# Patient Record
Sex: Male | Born: 1955
Health system: Southern US, Community
[De-identification: ages and names within clinical notes are randomized; demographics above are authoritative.]

## PROBLEM LIST (undated history)

## (undated) DIAGNOSIS — E785 Hyperlipidemia, unspecified: Secondary | ICD-10-CM

## (undated) DIAGNOSIS — E119 Type 2 diabetes mellitus without complications: Secondary | ICD-10-CM

## (undated) DIAGNOSIS — Z973 Presence of spectacles and contact lenses: Secondary | ICD-10-CM

## (undated) DIAGNOSIS — C61 Malignant neoplasm of prostate: Secondary | ICD-10-CM

## (undated) DIAGNOSIS — N401 Enlarged prostate with lower urinary tract symptoms: Secondary | ICD-10-CM

## (undated) DIAGNOSIS — I10 Essential (primary) hypertension: Secondary | ICD-10-CM

## (undated) DIAGNOSIS — Z9189 Other specified personal risk factors, not elsewhere classified: Secondary | ICD-10-CM

## (undated) HISTORY — PX: PROSTATE BIOPSY: SHX241

## (undated) HISTORY — PX: NO PAST SURGERIES: SHX2092

## (undated) HISTORY — DX: Hyperlipidemia, unspecified: E78.5

## (undated) HISTORY — DX: Essential (primary) hypertension: I10

---

## 2017-10-27 ENCOUNTER — Other Ambulatory Visit: Payer: Self-pay | Admitting: Physician Assistant

## 2017-10-27 DIAGNOSIS — H905 Unspecified sensorineural hearing loss: Secondary | ICD-10-CM | POA: Insufficient documentation

## 2017-10-27 DIAGNOSIS — H903 Sensorineural hearing loss, bilateral: Secondary | ICD-10-CM | POA: Insufficient documentation

## 2017-10-27 DIAGNOSIS — H9312 Tinnitus, left ear: Secondary | ICD-10-CM | POA: Insufficient documentation

## 2017-11-05 ENCOUNTER — Ambulatory Visit
Admission: RE | Admit: 2017-11-05 | Discharge: 2017-11-05 | Disposition: A | Discharge status: Home / Self Care | Payer: BLUE CROSS/BLUE SHIELD | Source: Ambulatory Visit | Attending: Physician Assistant | Admitting: Physician Assistant

## 2017-11-05 DIAGNOSIS — H9312 Tinnitus, left ear: Secondary | ICD-10-CM

## 2017-11-05 MED ORDER — GADOBENATE DIMEGLUMINE 529 MG/ML IV SOLN
20.0000 mL | Freq: Once | INTRAVENOUS | Status: AC | PRN
  Administered 2017-11-05: 20 mL via INTRAVENOUS

## 2018-06-23 DIAGNOSIS — R972 Elevated prostate specific antigen [PSA]: Secondary | ICD-10-CM | POA: Diagnosis not present

## 2018-07-01 ENCOUNTER — Ambulatory Visit: Payer: BLUE CROSS/BLUE SHIELD | Admitting: Endocrinology

## 2018-07-01 ENCOUNTER — Encounter: Payer: Self-pay | Admitting: Endocrinology

## 2018-07-01 DIAGNOSIS — I10 Essential (primary) hypertension: Secondary | ICD-10-CM | POA: Diagnosis not present

## 2018-07-01 DIAGNOSIS — E785 Hyperlipidemia, unspecified: Secondary | ICD-10-CM | POA: Diagnosis not present

## 2018-07-01 DIAGNOSIS — E119 Type 2 diabetes mellitus without complications: Secondary | ICD-10-CM | POA: Insufficient documentation

## 2018-07-01 LAB — POCT GLYCOSYLATED HEMOGLOBIN (HGB A1C): Hemoglobin A1C: 9.8 % — AB (ref 4.0–5.6)

## 2018-07-01 MED ORDER — GLUCOSE BLOOD VI STRP: 1.0000 | ORAL_STRIP | Freq: Every day | 12 refills | Status: DC

## 2018-07-01 MED ORDER — SEMAGLUTIDE (1 MG/DOSE) 2 MG/1.5ML ~~LOC~~ SOPN: 1.0000 mg | PEN_INJECTOR | SUBCUTANEOUS | 11 refills | Status: DC

## 2018-07-01 NOTE — Progress Notes (Signed)
Subjective:    Patient ID: Tony Bogus., male    DOB: 11-Nov-1955, 62 y.o.   MRN: [id number]  HPI pt is referred by Docia Barrier, PA, for diabetes.  Pt states DM was dx'ed in 2017; he has mild if any neuropathy of the lower extremities; he is unaware of any associated chronic complications; he has never been on insulin; pt says his diet and exercise are poor; he has never had pancreatitis, pancreatic surgery, severe hypoglycemia or DKA.  He has not recently checked cbg's. Past Medical History:  Diagnosis Date  . Dyslipidemia   . HTN (hypertension)     Social History   Socioeconomic History  . Marital status: Married    Spouse name: Not on file  . Number of children: Not on file  . Years of education: Not on file  . Highest education level: Not on file  Occupational History  . Not on file  Social Needs  . Financial resource strain: Not on file  . Food insecurity:    Worry: Not on file    Inability: Not on file  . Transportation needs:    Medical: Not on file    Non-medical: Not on file  Tobacco Use  . Smoking status: Never Smoker  . Smokeless tobacco: Never Used  Substance and Sexual Activity  . Alcohol use: Yes    Comment: occ  . Drug use: Never  . Sexual activity: Yes    Partners: Female  Lifestyle  . Physical activity:    Days per week: Not on file    Minutes per session: Not on file  . Stress: Not on file  Relationships  . Social connections:    Talks on phone: Not on file    Gets together: Not on file    Attends religious service: Not on file    Active member of club or organization: Not on file    Attends meetings of clubs or organizations: Not on file    Relationship status: Not on file  . Intimate partner violence:    Fear of current or ex partner: Not on file    Emotionally abused: Not on file    Physically abused: Not on file    Forced sexual activity: Not on file  Other Topics Concern  . Not on file  Social History Narrative  . Not on file     Current Outpatient Medications on File Prior to Visit  Medication Sig Dispense Refill  . HYDROcodone-acetaminophen (NORCO/VICODIN) 5-325 MG tablet TAKE 1 TABLET BY MOUTH AS DIRECTED. TAKE 30 MINUTES PRIOR TO YOUR PROSTATE BIOPSY  0  . lisinopril (PRINIVIL,ZESTRIL) 10 MG tablet     . metFORMIN (GLUCOPHAGE) 500 MG tablet Take 500 mg by mouth 2 (two) times daily.  3  . omeprazole (PRILOSEC) 40 MG capsule TAKE 1 (ONE) CAPSULE DAILY    . rosuvastatin (CRESTOR) 10 MG tablet      No current facility-administered medications on file prior to visit.     No Known Allergies  Family History  Problem Relation Age of Onset  . Diabetes Brother     BP (!) 190/110 (BP Location: Right Arm, Patient Position: Sitting, Cuff Size: Large)   Pulse 82   Ht 6' (1.829 m)   Wt (!) 341 lb 3.2 oz (154.8 kg)   SpO2 97%   BMI 46.28 kg/m    Review of Systems denies blurry vision, headache, chest pain, sob, n/v, muscle cramps, excessive diaphoresis, memory loss, depression, cold intolerance, rhinorrhea,  and easy bruising.  He has chronic weight gain.  He has polyuria.      Objective:   Physical Exam VS: see vs page GEN: no distress.  Morbid obesity HEAD: head: no deformity eyes: no periorbital swelling, no proptosis external nose and ears are normal mouth: no lesion seen NECK: supple, thyroid is not enlarged CHEST WALL: no deformity LUNGS: clear to auscultation CV: reg rate and rhythm, no murmur ABD: abdomen is soft, nontender.  no hepatosplenomegaly.  not distended.  Self-reducing ventral hernia MUSCULOSKELETAL: muscle bulk and strength are grossly normal.  no obvious joint swelling.  gait is normal and steady EXTEMITIES: no deformity.  no ulcer on the feet.  feet are of normal color and temp.  1+ bilat leg edema.  There is bilateral onychomycosis of the toenails.   PULSES: dorsalis pedis intact bilat.  no carotid bruit NEURO:  cn 2-12 grossly intact.   readily moves all 4's.  sensation is intact  to touch on the feet SKIN:  Normal texture and temperature.  No rash or suspicious lesion is visible.   NODES:  None palpable at the neck.   PSYCH: alert, well-oriented.  Does not appear anxious nor depressed.    A1c=9.8%  I have reviewed outside records, and summarized: Pt was noted to have elevated a1c, and referred here.  Other problems addressed were ongoing URI, HTN, and vit-D def  outside test results are reviewed: Creat=1.0    Assessment & Plan:  Type 2 DM: he needs increased rx Edema: this limits rx options Obesity: new to me HTN: is noted today  Patient Instructions  Your blood pressure is high today.  Please see your primary care provider soon, to have it rechecked. good diet and exercise significantly improve the control of your diabetes.  please let me know if you wish to be referred to a dietician.  high blood sugar is very risky to your health.  you should see an eye doctor and dentist every year.  It is very important to get all recommended vaccinations.  Controlling your blood pressure and cholesterol drastically reduces the damage diabetes does to your body.  Those who smoke should quit.  Please discuss these with your doctor.  check your blood sugar once a day.  vary the time of day when you check, between before the 3 meals, and at bedtime.  also check if you have symptoms of your blood sugar being too high or too low.  please keep a record of the readings and bring it to your next appointment here (or you can bring the meter itself).  You can write it on any piece of paper.  please call us sooner if your blood sugar goes below 70, or if you have a lot of readings over 200.   Here is a new meter.  I have sent a prescription to your pharmacy, for strips.  Please change the Januvia to Ozempic. Please call or message [place] in 1-2 weeks, to tell us how the blood sugar is doing.   We can add other pills if necessary.   Please come back for a follow-up appointment in 2 months.         Bariatric Surgery You have so much to gain by losing weight.  You may have already tried every diet and exercise plan imaginable.  And, you may have sought advice from your family physician, too.   Sometimes, in spite of such diligent efforts, you may not be able to achieve long-term  results by yourself.  In cases of severe obesity, bariatric or weight loss surgery is a proven method of achieving long-term weight control.  Our Services Our bariatric surgery programs offer our patients new hope and long-term weight-loss solution.  Since introducing our services in 2003, we have conducted more than 2,400 successful procedures.  Our program is designated as a Programmer, multimedia by the Metabolic and Bariatric Surgery Accreditation and Quality Improvement Program (MBSAQIP), a IT trainer that sets rigorous patient safety and outcome standards.  Our program is also designated as a Ecologist by [employer].   Our exceptional weight-loss surgery team specializes in diagnosis, treatment, follow-up care, and ongoing support for our patients with severe weight loss challenges.  We currently offer laparoscopic sleeve gastrectomy, gastric bypass, and adjustable gastric band (LAP-BAND).    Attend our [place] Choosing to undergo a bariatric procedure is a big decision, and one that should not be taken lightly.  You now have two options in how you learn about weight-loss surgery - in person or online.  Our objective is to ensure you have all of the information that you need to evaluate the advantages and obligations of this life changing procedure.  Please note that you are not alone in this process, and our experienced team is ready to assist and answer all of your questions.  There are several ways to register for a seminar (either on-line or in person): 1)  Call [phone] 2) Go on-line to [facility] and register for either type of seminar.   [email or web address]

## 2018-07-01 NOTE — Patient Instructions (Addendum)
Your blood pressure is high today.  Please see your primary care provider soon, to have it rechecked. good diet and exercise significantly improve the control of your diabetes.  please let me know if you wish to be referred to a dietician.  high blood sugar is very risky to your health.  you should see an eye doctor and dentist every year.  It is very important to get all recommended vaccinations.  Controlling your blood pressure and cholesterol drastically reduces the damage diabetes does to your body.  Those who smoke should quit.  Please discuss these with your doctor.  check your blood sugar once a day.  vary the time of day when you check, between before the 3 meals, and at bedtime.  also check if you have symptoms of your blood sugar being too high or too low.  please keep a record of the readings and bring it to your next appointment here (or you can bring the meter itself).  You can write it on any piece of paper.  please call us sooner if your blood sugar goes below 70, or if you have a lot of readings over 200.   Here is a new meter.  I have sent a prescription to your pharmacy, for strips.  Please change the Januvia to Ozempic. Please call or message [place] in 1-2 weeks, to tell us how the blood sugar is doing.   We can add other pills if necessary.   Please come back for a follow-up appointment in 2 months.        Bariatric Surgery You have so much to gain by losing weight.  You may have already tried every diet and exercise plan imaginable.  And, you may have sought advice from your family physician, too.   Sometimes, in spite of such diligent efforts, you may not be able to achieve long-term results by yourself.  In cases of severe obesity, bariatric or weight loss surgery is a proven method of achieving long-term weight control.  Our Services Our bariatric surgery programs offer our patients new hope and long-term weight-loss solution.  Since introducing our services in 2003, we have  conducted more than 2,400 successful procedures.  Our program is designated as a Programmer, multimedia by the Metabolic and Bariatric Surgery Accreditation and Quality Improvement Program (MBSAQIP), a IT trainer that sets rigorous patient safety and outcome standards.  Our program is also designated as a Ecologist by [employer].   Our exceptional weight-loss surgery team specializes in diagnosis, treatment, follow-up care, and ongoing support for our patients with severe weight loss challenges.  We currently offer laparoscopic sleeve gastrectomy, gastric bypass, and adjustable gastric band (LAP-BAND).    Attend our [place] Choosing to undergo a bariatric procedure is a big decision, and one that should not be taken lightly.  You now have two options in how you learn about weight-loss surgery - in person or online.  Our objective is to ensure you have all of the information that you need to evaluate the advantages and obligations of this life changing procedure.  Please note that you are not alone in this process, and our experienced team is ready to assist and answer all of your questions.  There are several ways to register for a seminar (either on-line or in person): 1)  Call [phone] 2) Go on-line to [facility] and register for either type of seminar.  [email or web address]

## 2018-07-03 ENCOUNTER — Encounter: Payer: Self-pay | Admitting: Endocrinology

## 2018-07-03 DIAGNOSIS — E785 Hyperlipidemia, unspecified: Secondary | ICD-10-CM | POA: Insufficient documentation

## 2018-07-03 DIAGNOSIS — I10 Essential (primary) hypertension: Secondary | ICD-10-CM | POA: Insufficient documentation

## 2018-07-06 DIAGNOSIS — E119 Type 2 diabetes mellitus without complications: Secondary | ICD-10-CM | POA: Diagnosis not present

## 2018-07-13 ENCOUNTER — Telehealth: Payer: Self-pay | Admitting: Endocrinology

## 2018-07-13 MED ORDER — GLUCOSE BLOOD VI STRP: 1.0000 | ORAL_STRIP | Freq: Every day | 3 refills | Status: DC

## 2018-07-13 NOTE — Telephone Encounter (Signed)
Patient stated when he was in last week he was given a contour next EZ meter. He stated that the prescription that he was given was for the one touch ultra blue and they do not work with his meter.  Please advise   [id number] - [place], [place] - [address]

## 2018-07-13 NOTE — Telephone Encounter (Signed)
Please advise 

## 2018-07-13 NOTE — Telephone Encounter (Signed)
Ok, I have sent a prescription to your pharmacy, for the contour strips

## 2018-07-18 DIAGNOSIS — C61 Malignant neoplasm of prostate: Secondary | ICD-10-CM | POA: Diagnosis not present

## 2018-07-18 DIAGNOSIS — R351 Nocturia: Secondary | ICD-10-CM | POA: Diagnosis not present

## 2018-07-18 DIAGNOSIS — N401 Enlarged prostate with lower urinary tract symptoms: Secondary | ICD-10-CM | POA: Diagnosis not present

## 2018-07-20 ENCOUNTER — Encounter: Payer: Self-pay | Admitting: Radiation Oncology

## 2018-07-25 ENCOUNTER — Other Ambulatory Visit (HOSPITAL_COMMUNITY): Payer: Self-pay | Admitting: Urology

## 2018-07-25 DIAGNOSIS — C61 Malignant neoplasm of prostate: Secondary | ICD-10-CM

## 2018-07-26 DIAGNOSIS — E7800 Pure hypercholesterolemia, unspecified: Secondary | ICD-10-CM | POA: Diagnosis not present

## 2018-07-26 DIAGNOSIS — R972 Elevated prostate specific antigen [PSA]: Secondary | ICD-10-CM | POA: Diagnosis not present

## 2018-07-26 DIAGNOSIS — I10 Essential (primary) hypertension: Secondary | ICD-10-CM | POA: Diagnosis not present

## 2018-07-26 DIAGNOSIS — Z79899 Other long term (current) drug therapy: Secondary | ICD-10-CM | POA: Diagnosis not present

## 2018-07-26 DIAGNOSIS — E1165 Type 2 diabetes mellitus with hyperglycemia: Secondary | ICD-10-CM | POA: Diagnosis not present

## 2018-08-04 NOTE — Progress Notes (Signed)
GU Location of Tumor / Histology: prostatic adenocarcinoma  If Prostate Cancer, Gleason Score is (4 + 3) and PSA is (8.8) on 03/2018. Prostate volume: 63 cm  Tony Griffin. was referred by Docia Barrier, PA-C for evaluation of an elevated PSA value in June 2019.   Biopsies of prostate (if applicable) revealed:    Past/Anticipated interventions by urology, if any: vasectomy, prostate biopsy  Past/Anticipated interventions by medical oncology, if any: no  Weight changes, if any: no  Bowel/Bladder complaints, if any:  IPSS 7. SHIM 18.   Nausea/Vomiting, if any: no  Pain issues, if any:  no  SAFETY ISSUES:  Prior radiation? no  Pacemaker/ICD? no  Possible current pregnancy? no  Is the patient on methotrexate? no  Current Complaints / other details:  63 year old male. Married with one son. Bone scan done 08/05/2018.

## 2018-08-05 ENCOUNTER — Encounter (HOSPITAL_COMMUNITY)
Admission: RE | Admit: 2018-08-05 | Discharge: 2018-08-05 | Disposition: A | Discharge status: Home / Self Care | Payer: BLUE CROSS/BLUE SHIELD | Source: Ambulatory Visit | Attending: Urology | Admitting: Urology

## 2018-08-05 DIAGNOSIS — C61 Malignant neoplasm of prostate: Secondary | ICD-10-CM | POA: Diagnosis not present

## 2018-08-05 MED ORDER — TECHNETIUM TC 99M MEDRONATE IV KIT
20.0000 | PACK | Freq: Once | INTRAVENOUS | Status: AC | PRN
  Administered 2018-08-05: 19.7 via INTRAVENOUS

## 2018-08-08 ENCOUNTER — Ambulatory Visit
Admission: RE | Admit: 2018-08-08 | Discharge: 2018-08-08 | Disposition: A | Discharge status: Home / Self Care | Payer: BLUE CROSS/BLUE SHIELD | Source: Ambulatory Visit | Attending: Radiation Oncology | Admitting: Radiation Oncology

## 2018-08-08 ENCOUNTER — Encounter: Payer: Self-pay | Admitting: Radiation Oncology

## 2018-08-08 ENCOUNTER — Other Ambulatory Visit: Payer: Self-pay

## 2018-08-08 VITALS — BP 138/85 | HR 88 | Temp 97.6°F | Resp 18 | Ht 72.0 in | Wt 333.8 lb

## 2018-08-08 DIAGNOSIS — R972 Elevated prostate specific antigen [PSA]: Secondary | ICD-10-CM | POA: Diagnosis not present

## 2018-08-08 DIAGNOSIS — C61 Malignant neoplasm of prostate: Secondary | ICD-10-CM | POA: Insufficient documentation

## 2018-08-08 DIAGNOSIS — Z79899 Other long term (current) drug therapy: Secondary | ICD-10-CM | POA: Insufficient documentation

## 2018-08-08 DIAGNOSIS — E785 Hyperlipidemia, unspecified: Secondary | ICD-10-CM | POA: Insufficient documentation

## 2018-08-08 DIAGNOSIS — I10 Essential (primary) hypertension: Secondary | ICD-10-CM | POA: Diagnosis not present

## 2018-08-08 DIAGNOSIS — Z7984 Long term (current) use of oral hypoglycemic drugs: Secondary | ICD-10-CM | POA: Insufficient documentation

## 2018-08-08 DIAGNOSIS — Z803 Family history of malignant neoplasm of breast: Secondary | ICD-10-CM | POA: Diagnosis not present

## 2018-08-08 HISTORY — DX: Malignant neoplasm of prostate: C61

## 2018-08-08 NOTE — Progress Notes (Signed)
2 Radiation Oncology         [phone]) [id number] ________________________________  Initial Outpatient Consultation  Name: Tony Griffin. MRN: [id number]  Date: 08/08/2018  DOB: July 22, 1956  [id number], Christian Mate, PA  Davis Gourd*   REFERRING PHYSICIAN: Davis Gourd*  DIAGNOSIS: 62 y.o. gentleman with unfavorable intermediate risk, Stage T1c adenocarcinoma of the prostate with Gleason Score of 4+3, and PSA of 8.8    ICD-10-CM   1. Malignant neoplasm of prostate [facility]) C61 Ambulatory referral to Social Work    HISTORY OF PRESENT ILLNESS: Tony Griffin. is a 62 y.o. male with a diagnosis of prostate cancer. He was noted to have an elevated PSA of 8.8 by his primary care physician, Docia Barrier, PA-C.  Accordingly, he was referred for evaluation in urology by Dr. Lovena Neighbours on 05/06/18,  digital rectal examination was performed at that time revealing no abnormalities.  The patient proceeded to transrectal ultrasound with 12 biopsies of the prostate on 06/23/18.  The prostate volume measured 63 cc.  Out of 12 core biopsies, 3 were positive.  The maximum Gleason score was 4+3 = 7, and this was seen in left apex and left apex lateral. Bone scan and CT A/P on 08/05/18 were negative for metastatic disease. The patient reviewed the biopsy results with his urologist and he has kindly been referred today for discussion of potential radiation treatment options.   PREVIOUS RADIATION THERAPY: No  PAST MEDICAL HISTORY:  Past Medical History:  Diagnosis Date  . Dyslipidemia   . HTN (hypertension)   . Prostate cancer ([place])       PAST SURGICAL HISTORY: Past Surgical History:  Procedure Laterality Date  . PROSTATE BIOPSY      FAMILY HISTORY:  Family History  Problem Relation Age of Onset  . Diabetes Brother   . Non-Hodgkin's lymphoma Father   . Breast cancer Paternal Aunt   . Breast cancer Paternal Aunt   . Prostate cancer Neg Hx   . Pancreatic cancer Neg Hx   . Colon cancer Neg Hx      SOCIAL HISTORY:  Social History   Socioeconomic History  . Marital status: Married    Spouse name: [name]  . Number of children: 1  . Years of education: Not on file  . Highest education level: Not on file  Occupational History  . Not on file  Social Needs  . Financial resource strain: Not on file  . Food insecurity:    Worry: Not on file    Inability: Not on file  . Transportation needs:    Medical: Not on file    Non-medical: Not on file  Tobacco Use  . Smoking status: Never Smoker  . Smokeless tobacco: Never Used  Substance and Sexual Activity  . Alcohol use: Yes    Comment: occ  . Drug use: Never  . Sexual activity: Yes    Partners: Female  Lifestyle  . Physical activity:    Days per week: Not on file    Minutes per session: Not on file  . Stress: Not on file  Relationships  . Social connections:    Talks on phone: Not on file    Gets together: Not on file    Attends religious service: Not on file    Active member of club or organization: Not on file    Attends meetings of clubs or organizations: Not on file    Relationship status: Not on file  . Intimate partner violence:  Fear of current or ex partner: Not on file    Emotionally abused: Not on file    Physically abused: Not on file    Forced sexual activity: Not on file  Other Topics Concern  . Not on file  Social History Narrative  . Not on file  The patient is married and lives in [place].  ALLERGIES: Patient has no known allergies.  MEDICATIONS:  Current Outpatient Medications  Medication Sig Dispense Refill  . glucose blood (CONTOUR TEST) test strip 1 each by Other route daily. And lancets 1/day 100 each 3  . lisinopril (PRINIVIL,ZESTRIL) 10 MG tablet     . metFORMIN (GLUCOPHAGE) 500 MG tablet Take 500 mg by mouth 2 (two) times daily.  3  . omeprazole (PRILOSEC) 40 MG capsule TAKE 1 (ONE) CAPSULE DAILY    . rosuvastatin (CRESTOR) 10 MG tablet     . Semaglutide (OZEMPIC) 1 MG/DOSE SOPN  Inject 1 mg into the skin once a week. 4 pen 11   No current facility-administered medications for this encounter.     REVIEW OF SYSTEMS:  On review of systems, the patient reports that he is doing well overall. He denies any chest pain, shortness of breath, cough, fevers, chills, night sweats, unintended weight changes. He denies abdominal pain, nausea or vomiting. He denies any new musculoskeletal or joint aches or pains. His IPSS was 7, indicating mild urinary symptoms. He reports nocturia every 1-2 hours. He is able to complete sexual activity with most attempts. A complete review of systems is obtained and is otherwise negative.    PHYSICAL EXAM:  Wt Readings from Last 3 Encounters:  08/08/18 (!) 333 lb 12.8 oz (151.4 kg)  07/01/18 (!) 341 lb 3.2 oz (154.8 kg)   Temp Readings from Last 3 Encounters:  08/08/18 97.6 F (36.4 C) (Oral)   BP Readings from Last 3 Encounters:  08/08/18 138/85  07/01/18 (!) 190/110   Pulse Readings from Last 3 Encounters:  08/08/18 88  07/01/18 82   Pain Assessment Pain Score: 0-No pain/10  In general this is a well appearing Caucasian gentleman in no acute distress. He is alert and oriented x4 and appropriate throughout the examination. HEENT reveals that the patient is normocephalic, atraumatic. EOMs are intact. Cardiopulmonary assessment is negative for acute distress and he exhibits normal effort.    KPS = 100  100 - Normal; no complaints; no evidence of disease. 90   - Able to carry on normal activity; minor signs or symptoms of disease. 80   - Normal activity with effort; some signs or symptoms of disease. 51   - Cares for self; unable to carry on normal activity or to do active work. 60   - Requires occasional assistance, but is able to care for most of his personal needs. 50   - Requires considerable assistance and frequent medical care. 22   - Disabled; requires special care and assistance. 83   - Severely disabled; hospital admission  is indicated although death not imminent. 74   - Very sick; hospital admission necessary; active supportive treatment necessary. 10   - Moribund; fatal processes progressing rapidly. 0     - Dead  Karnofsky DA, Abelmann WH, Craver LS and Burchenal JH [phone]) The use of the nitrogen mustards in the palliative treatment of carcinoma: with particular reference to bronchogenic carcinoma Cancer 1 634-56  LABORATORY DATA:  No results found for: WBC, HGB, HCT, MCV, PLT No results found for: NA, K, CL, CO2 No  results found for: ALT, AST, GGT, ALKPHOS, BILITOT   RADIOGRAPHY: Nm Bone Scan Whole Body  Result Date: 08/06/2018 CLINICAL DATA:  Prostate cancer.  Staging. EXAM: NUCLEAR MEDICINE WHOLE BODY BONE SCAN TECHNIQUE: Whole body anterior and posterior images were obtained approximately 3 hours after intravenous injection of radiopharmaceutical. RADIOPHARMACEUTICALS:  19.7 mCi Technetium-17m MDP IV COMPARISON:  None. FINDINGS: There is a focus of uptake at a lower right [place] chondral junction. No definitive CT correlate on the recent CT the abdomen and pelvis. Minimal uptake in left mandible consistent with thought I dont Janik disease. Degenerative changes in the knees and left foot. Degenerative changes in the thoracic spine. IMPRESSION: 1. There is mild uptake in a right lower costochondral junction, likely from previous trauma or another benign cause. Metastatic disease considered unlikely. Recommend attention on follow-up. 2. Other scattered degenerative changes. No other evidence of metastatic disease. Electronically Signed   By: Dorise Bullion III M.D   On: 08/06/2018 15:15      IMPRESSION/PLAN: 1. 62 y.o. gentleman with unfavorable intermediate risk, Stage T1c adenocarcinoma of the prostate with Gleason Score of 4+3, and PSA of 8.8. Dr. Tammi Klippel discussed the patient's workup and outlines the nature of prostate cancer in this setting. The patient's T stage, Gleason's score, and PSA put him into the  unfavorable intermediate risk group. Accordingly, he is eligible for a variety of potential treatment options including brachytherapy, 5-8 weeks of external radiation, and possibly surgical resection. While surgery was technically not eliminated in his last discussion with Dr. Gloriann Loan, his weight puts him at increased risk of postoperative complications. We discussed the available radiation techniques, and focused on the details and logistics and delivery. We discussed and outlined the risks, benefits, short and long-term effects associated with radiotherapy and compared and contrasted these with prostatectomy. We discussed the role of SpaceOAR in reducing the rectal toxicity associated with radiotherapy. The patient expressed interest in external beam radiotherapy.   The patient would like to proceed with prostate IMRT.  I will share my findings with Dr. Lovena Neighbours and move forward with scheduling placement of three gold fiducial markers into the prostate to proceed with IMRT in the near future. We enjoyed meeting with him today, and will look forward to participating in the care of this very nice gentleman. He is scheduled to see Dr. Gloriann Loan in October 2019. We will plan to begin treatment in November, after his planned vacation.  In a visit lasting 60 minutes, greater than 50% of the time was spent face to face discussing his case, and coordinating the patient's care.    Carola Rhine, PAC    Tyler Pita, MD  [facility] Direct Dial: [phone]  Fax: [phone] [email or web address]  Skype  [email or web address]  This document serves as a record of services personally performed by Shona Simpson, PA-C and Dr. Tammi Klippel. It was created on their behalf by Wilburn Mylar, a trained medical scribe. The creation of this record is based on the scribe's personal observations and the provider's statements to them. This document has been checked and approved by the attending provider.

## 2018-08-08 NOTE — Progress Notes (Signed)
See progress note under physician encounter. 

## 2018-08-09 DIAGNOSIS — C61 Malignant neoplasm of prostate: Secondary | ICD-10-CM | POA: Insufficient documentation

## 2018-08-11 ENCOUNTER — Encounter: Payer: Self-pay | Admitting: General Practice

## 2018-08-11 NOTE — Progress Notes (Signed)
[place] Psychosocial Distress Screening Clinical Social Work  Clinical Social Work was referred by distress screening protocol.  The patient scored a 7 on the Psychosocial Distress Thermometer which indicates moderate distress. Clinical Social Worker contacted patient by phone to assess for distress and other psychosocial needs. Pt reports no concerns at this time, appreciated meeting treatment team and getting plan in place.  Does not anticipate problems in carrying out tx plan.  Briefly explained [facility] and its services, will mail information packet so patient can review and access as needed.    ONCBCN DISTRESS SCREENING 08/08/2018  Screening Type Initial Screening  Distress experienced in past week (1-10) 7  Emotional problem type Adjusting to illness  Physical Problem type Nausea/vomiting  Physician notified of physical symptoms Yes  Referral to clinical psychology No  Referral to clinical social work No  Referral to dietition No  Referral to financial advocate No  Referral to support programs No  Referral to palliative care No    Clinical Social Worker follow up needed: No.  If yes, follow up plan:  Beverely Pace, [place], LCSW Clinical Social Worker Phone:  [phone]

## 2018-08-31 ENCOUNTER — Encounter: Payer: Self-pay | Admitting: Endocrinology

## 2018-08-31 ENCOUNTER — Ambulatory Visit (INDEPENDENT_AMBULATORY_CARE_PROVIDER_SITE_OTHER): Payer: BLUE CROSS/BLUE SHIELD | Admitting: Endocrinology

## 2018-08-31 VITALS — BP 148/92 | HR 96 | Ht 72.0 in | Wt 328.0 lb

## 2018-08-31 DIAGNOSIS — E119 Type 2 diabetes mellitus without complications: Secondary | ICD-10-CM | POA: Diagnosis not present

## 2018-08-31 LAB — POCT GLYCOSYLATED HEMOGLOBIN (HGB A1C): Hemoglobin A1C: 7.4 % — AB (ref 4.0–5.6)

## 2018-08-31 MED ORDER — DAPAGLIFLOZIN PROPANEDIOL 5 MG PO TABS: 5.0000 mg | ORAL_TABLET | Freq: Every day | ORAL | 11 refills | Status: DC

## 2018-08-31 NOTE — Progress Notes (Signed)
Subjective:    Patient ID: Tony Griffin., male    DOB: Dec 17, 1955, 62 y.o.   MRN: [id number]  HPI Pt returns for f/u of diabetes mellitus: DM type: 2 Dx'ed: 8270 Complications: none Therapy: Ozempic and metformin.   DKA: never Severe hypoglycemia: never Pancreatitis: never Pancreatic imaging: never Other: he has never been on insulin; he declines weight loss surgery.   Interval history: nausea is resolved.  He says cbg's are in the low to mid-100's.   Past Medical History:  Diagnosis Date  . Dyslipidemia   . HTN (hypertension)   . Prostate cancer [facility])     Past Surgical History:  Procedure Laterality Date  . PROSTATE BIOPSY      Social History   Socioeconomic History  . Marital status: Married    Spouse name: Tony Griffin  . Number of children: 1  . Years of education: Not on file  . Highest education level: Not on file  Occupational History  . Not on file  Social Needs  . Financial resource strain: Not on file  . Food insecurity:    Worry: Not on file    Inability: Not on file  . Transportation needs:    Medical: Not on file    Non-medical: Not on file  Tobacco Use  . Smoking status: Never Smoker  . Smokeless tobacco: Never Used  Substance and Sexual Activity  . Alcohol use: Yes    Comment: occ  . Drug use: Never  . Sexual activity: Yes    Partners: Female  Lifestyle  . Physical activity:    Days per week: Not on file    Minutes per session: Not on file  . Stress: Not on file  Relationships  . Social connections:    Talks on phone: Not on file    Gets together: Not on file    Attends religious service: Not on file    Active member of club or organization: Not on file    Attends meetings of clubs or organizations: Not on file    Relationship status: Not on file  . Intimate partner violence:    Fear of current or ex partner: Not on file    Emotionally abused: Not on file    Physically abused: Not on file    Forced sexual activity: Not on file  Other  Topics Concern  . Not on file  Social History Narrative  . Not on file    Current Outpatient Medications on File Prior to Visit  Medication Sig Dispense Refill  . glucose blood (CONTOUR TEST) test strip 1 each by Other route daily. And lancets 1/day 100 each 3  . lisinopril (PRINIVIL,ZESTRIL) 10 MG tablet     . metFORMIN (GLUCOPHAGE) 500 MG tablet Take 500 mg by mouth 2 (two) times daily.  3  . omeprazole (PRILOSEC) 40 MG capsule TAKE 1 (ONE) CAPSULE DAILY    . rosuvastatin (CRESTOR) 10 MG tablet     . Semaglutide (OZEMPIC) 1 MG/DOSE SOPN Inject 1 mg into the skin once a week. 4 pen 11   No current facility-administered medications on file prior to visit.     No Known Allergies  Family History  Problem Relation Age of Onset  . Diabetes Brother   . Non-Hodgkin's lymphoma Father   . Breast cancer Paternal Aunt   . Breast cancer Paternal Aunt   . Prostate cancer Neg Hx   . Pancreatic cancer Neg Hx   . Colon cancer Neg Hx  BP (!) 148/92   Pulse 96   Ht 6' (1.829 m)   Wt (!) 328 lb (148.8 kg)   SpO2 99%   BMI 44.48 kg/m    Review of Systems He denies hypoglycemia.      Objective:   Physical Exam VITAL SIGNS:  See vs page GENERAL: no distress Pulses: dorsalis pedis intact bilat.   MSK: no deformity of the feet CV: 1+ bilat leg edema Skin:  no ulcer on the feet.  normal color and temp on the feet. Neuro: sensation is intact to touch on the feet Ext: There is bilateral onychomycosis of the toenails.    A1c=7.4%     Assessment & Plan:  HTN: is noted today Type 2 DM: she needs increased rx  Patient Instructions  Your blood pressure is high today.  Please see your primary care provider soon, to have it rechecked I have sent a prescription to your pharmacy, to add "Wilder Glade." Please continue the same other diabetes medications. check your blood sugar once a day.  vary the time of day when you check, between before the 3 meals, and at bedtime.  also check if you  have symptoms of your blood sugar being too high or too low.  please keep a record of the readings and bring it to your next appointment here (or you can bring the meter itself).  You can write it on any piece of paper.  please call us sooner if your blood sugar goes below 70, or if you have a lot of readings over 200.  Please come back for a follow-up appointment in 3 months.

## 2018-08-31 NOTE — Patient Instructions (Addendum)
Your blood pressure is high today.  Please see your primary care provider soon, to have it rechecked I have sent a prescription to your pharmacy, to add "Wilder Glade." Please continue the same other diabetes medications. check your blood sugar once a day.  vary the time of day when you check, between before the 3 meals, and at bedtime.  also check if you have symptoms of your blood sugar being too high or too low.  please keep a record of the readings and bring it to your next appointment here (or you can bring the meter itself).  You can write it on any piece of paper.  please call us sooner if your blood sugar goes below 70, or if you have a lot of readings over 200.  Please come back for a follow-up appointment in 3 months.

## 2018-09-02 DIAGNOSIS — C61 Malignant neoplasm of prostate: Secondary | ICD-10-CM | POA: Diagnosis not present

## 2018-09-20 ENCOUNTER — Other Ambulatory Visit: Payer: Self-pay | Admitting: Urology

## 2018-09-21 ENCOUNTER — Telehealth: Payer: Self-pay | Admitting: *Deleted

## 2018-09-21 NOTE — Telephone Encounter (Signed)
CALLED PATIENT TO INFORM OF FID. MARKERS AND SPACE OAR ON 10-19-18 @ 11:15 AM @ [facility] OR AND HIS SIM ON 10-21-18 @ DR. MANNING'S OFFICE, LVM FOR A RETURN CALL

## 2018-10-05 ENCOUNTER — Other Ambulatory Visit: Payer: Self-pay | Admitting: Urology

## 2018-10-05 DIAGNOSIS — C61 Malignant neoplasm of prostate: Secondary | ICD-10-CM

## 2018-10-12 ENCOUNTER — Encounter (HOSPITAL_BASED_OUTPATIENT_CLINIC_OR_DEPARTMENT_OTHER): Payer: Self-pay | Admitting: *Deleted

## 2018-10-12 ENCOUNTER — Other Ambulatory Visit: Payer: Self-pay

## 2018-10-12 NOTE — Progress Notes (Signed)
Spoke w/ pt via phone for pre-op interview.  Npo after mn.  Arrive at [facility].  Needs istat and ekg.

## 2018-10-12 NOTE — Progress Notes (Signed)
   10/12/18 1509  OBSTRUCTIVE SLEEP APNEA  Have you ever been diagnosed with sleep apnea through a sleep study? No  Do you snore loudly (loud enough to be heard through closed doors)?  1  Do you often feel tired, fatigued, or sleepy during the daytime (such as falling asleep during driving or talking to someone)? 0  Has anyone observed you stop breathing during your sleep? 1  Do you have, or are you being treated for high blood pressure? 1  BMI more than 35 kg/m2? 1  Age > 22 (1-yes) 1  Male Gender (Yes=1) 1  Obstructive Sleep Apnea Score 6  Score 5 or greater  Results sent to PCP

## 2018-10-19 ENCOUNTER — Other Ambulatory Visit: Payer: Self-pay

## 2018-10-19 ENCOUNTER — Encounter (HOSPITAL_BASED_OUTPATIENT_CLINIC_OR_DEPARTMENT_OTHER): Payer: Self-pay

## 2018-10-19 ENCOUNTER — Encounter (HOSPITAL_BASED_OUTPATIENT_CLINIC_OR_DEPARTMENT_OTHER)
Admission: RE | Disposition: A | Discharge status: Home / Self Care | Payer: Self-pay | Source: Ambulatory Visit | Attending: Urology

## 2018-10-19 ENCOUNTER — Ambulatory Visit (HOSPITAL_BASED_OUTPATIENT_CLINIC_OR_DEPARTMENT_OTHER): Payer: BLUE CROSS/BLUE SHIELD | Admitting: Anesthesiology

## 2018-10-19 ENCOUNTER — Ambulatory Visit (HOSPITAL_BASED_OUTPATIENT_CLINIC_OR_DEPARTMENT_OTHER)
Admission: RE | Admit: 2018-10-19 | Discharge: 2018-10-19 | Disposition: A | Discharge status: Home / Self Care | Payer: BLUE CROSS/BLUE SHIELD | Source: Ambulatory Visit | Attending: Urology | Admitting: Urology

## 2018-10-19 DIAGNOSIS — C61 Malignant neoplasm of prostate: Secondary | ICD-10-CM | POA: Diagnosis not present

## 2018-10-19 DIAGNOSIS — Z6841 Body Mass Index (BMI) 40.0 and over, adult: Secondary | ICD-10-CM | POA: Insufficient documentation

## 2018-10-19 DIAGNOSIS — Z79899 Other long term (current) drug therapy: Secondary | ICD-10-CM | POA: Diagnosis not present

## 2018-10-19 DIAGNOSIS — E119 Type 2 diabetes mellitus without complications: Secondary | ICD-10-CM | POA: Insufficient documentation

## 2018-10-19 DIAGNOSIS — E785 Hyperlipidemia, unspecified: Secondary | ICD-10-CM | POA: Insufficient documentation

## 2018-10-19 DIAGNOSIS — Z7984 Long term (current) use of oral hypoglycemic drugs: Secondary | ICD-10-CM | POA: Diagnosis not present

## 2018-10-19 DIAGNOSIS — I10 Essential (primary) hypertension: Secondary | ICD-10-CM | POA: Diagnosis not present

## 2018-10-19 HISTORY — DX: Other specified personal risk factors, not elsewhere classified: Z91.89

## 2018-10-19 HISTORY — PX: GOLD SEED IMPLANT: SHX6343

## 2018-10-19 HISTORY — DX: Type 2 diabetes mellitus without complications: E11.9

## 2018-10-19 HISTORY — DX: Benign prostatic hyperplasia with lower urinary tract symptoms: N40.1

## 2018-10-19 HISTORY — DX: Presence of spectacles and contact lenses: Z97.3

## 2018-10-19 HISTORY — PX: SPACE OAR INSTILLATION: SHX6769

## 2018-10-19 LAB — POCT I-STAT 4, (NA,K, GLUC, HGB,HCT)
Glucose, Bld: 151 mg/dL — ABNORMAL HIGH (ref 70–99)
HCT: 45 % (ref 39.0–52.0)
Hemoglobin: 15.3 g/dL (ref 13.0–17.0)
Potassium: 3.8 mmol/L (ref 3.5–5.1)
Sodium: 136 mmol/L (ref 135–145)

## 2018-10-19 LAB — GLUCOSE, CAPILLARY: Glucose-Capillary: 134 mg/dL — ABNORMAL HIGH (ref 70–99)

## 2018-10-19 SURGERY — INSERTION, GOLD SEEDS
Anesthesia: General | Site: Rectum

## 2018-10-19 MED ORDER — FENTANYL CITRATE (PF) 100 MCG/2ML IJ SOLN
INTRAMUSCULAR | Status: AC
  Filled 2018-10-19: qty 2

## 2018-10-19 MED ORDER — FENTANYL CITRATE (PF) 100 MCG/2ML IJ SOLN
INTRAMUSCULAR | Status: DC | PRN
  Administered 2018-10-19 (×4): 25 ug via INTRAVENOUS

## 2018-10-19 MED ORDER — PROPOFOL 10 MG/ML IV BOLUS
INTRAVENOUS | Status: AC
  Filled 2018-10-19: qty 20

## 2018-10-19 MED ORDER — PROMETHAZINE HCL 25 MG/ML IJ SOLN
6.2500 mg | INTRAMUSCULAR | Status: DC | PRN
  Filled 2018-10-19: qty 1

## 2018-10-19 MED ORDER — SODIUM CHLORIDE FLUSH 0.9 % IV SOLN
INTRAVENOUS | Status: DC | PRN
  Administered 2018-10-19: 10 mL via INTRAVENOUS

## 2018-10-19 MED ORDER — HYDROMORPHONE HCL 1 MG/ML IJ SOLN
0.2500 mg | INTRAMUSCULAR | Status: DC | PRN
  Filled 2018-10-19: qty 0.5

## 2018-10-19 MED ORDER — KETOROLAC TROMETHAMINE 30 MG/ML IJ SOLN
INTRAMUSCULAR | Status: AC
  Filled 2018-10-19: qty 1

## 2018-10-19 MED ORDER — ONDANSETRON HCL 4 MG/2ML IJ SOLN
INTRAMUSCULAR | Status: AC
  Filled 2018-10-19: qty 2

## 2018-10-19 MED ORDER — DEXTROSE 5 % IV SOLN
3000.0000 mg | Freq: Once | INTRAVENOUS | Status: DC
  Filled 2018-10-19: qty 30

## 2018-10-19 MED ORDER — ONDANSETRON HCL 4 MG/2ML IJ SOLN
INTRAMUSCULAR | Status: DC | PRN
  Administered 2018-10-19: 4 mg via INTRAVENOUS

## 2018-10-19 MED ORDER — CEFAZOLIN SODIUM-DEXTROSE 2-4 GM/100ML-% IV SOLN
2.0000 g | Freq: Once | INTRAVENOUS | Status: DC
  Filled 2018-10-19: qty 100

## 2018-10-19 MED ORDER — LACTATED RINGERS IV SOLN
INTRAVENOUS | Status: DC
  Administered 2018-10-19: 10:00:00 via INTRAVENOUS
  Filled 2018-10-19: qty 1000

## 2018-10-19 MED ORDER — LIDOCAINE 2% (20 MG/ML) 5 ML SYRINGE
INTRAMUSCULAR | Status: DC | PRN
  Administered 2018-10-19: 100 mg via INTRAVENOUS

## 2018-10-19 MED ORDER — DEXAMETHASONE SODIUM PHOSPHATE 10 MG/ML IJ SOLN
INTRAMUSCULAR | Status: DC | PRN
  Administered 2018-10-19: 5 mg via INTRAVENOUS

## 2018-10-19 MED ORDER — MIDAZOLAM HCL 2 MG/2ML IJ SOLN
INTRAMUSCULAR | Status: DC | PRN
  Administered 2018-10-19: 2 mg via INTRAVENOUS

## 2018-10-19 MED ORDER — DEXTROSE 5 % IV SOLN
3.0000 g | INTRAVENOUS | Status: AC
  Administered 2018-10-19: 3 g via INTRAVENOUS
  Filled 2018-10-19 (×2): qty 3000

## 2018-10-19 MED ORDER — DEXAMETHASONE SODIUM PHOSPHATE 10 MG/ML IJ SOLN
INTRAMUSCULAR | Status: AC
  Filled 2018-10-19: qty 1

## 2018-10-19 MED ORDER — MIDAZOLAM HCL 2 MG/2ML IJ SOLN
INTRAMUSCULAR | Status: AC
  Filled 2018-10-19: qty 2

## 2018-10-19 MED ORDER — LIDOCAINE 2% (20 MG/ML) 5 ML SYRINGE
INTRAMUSCULAR | Status: AC
  Filled 2018-10-19: qty 5

## 2018-10-19 MED ORDER — PROPOFOL 10 MG/ML IV BOLUS
INTRAVENOUS | Status: DC | PRN
  Administered 2018-10-19: 300 mg via INTRAVENOUS

## 2018-10-19 SURGICAL SUPPLY — 22 items
COVER TABLE BACK 60X90 (DRAPES) ×4 IMPLANT
DRSG TEGADERM 4X4.75 (GAUZE/BANDAGES/DRESSINGS) ×4 IMPLANT
DRSG TEGADERM 8X12 (GAUZE/BANDAGES/DRESSINGS) ×8 IMPLANT
GAUZE SPONGE 4X4 12PLY STRL (GAUZE/BANDAGES/DRESSINGS) ×4 IMPLANT
GLOVE BIO SURGEON STRL SZ 6.5 (GLOVE) ×3 IMPLANT
GLOVE BIO SURGEON STRL SZ7.5 (GLOVE) ×4 IMPLANT
GLOVE BIO SURGEONS STRL SZ 6.5 (GLOVE) ×1
GLOVE BIOGEL PI IND STRL 6.5 (GLOVE) ×2 IMPLANT
GLOVE BIOGEL PI IND STRL 7.5 (GLOVE) ×2 IMPLANT
GLOVE BIOGEL PI INDICATOR 6.5 (GLOVE) ×2
GLOVE BIOGEL PI INDICATOR 7.5 (GLOVE) ×2
GLOVE ECLIPSE 8.0 STRL XLNG CF (GLOVE) ×8 IMPLANT
GOWN STRL REUS W/TWL LRG LVL3 (GOWN DISPOSABLE) ×8 IMPLANT
GOWN STRL REUS W/TWL XL LVL3 (GOWN DISPOSABLE) ×4 IMPLANT
IMPL SPACEOAR SYSTEM 10ML (Spacer) ×2 IMPLANT
IMPLANT SPACEOAR SYSTEM 10ML (Spacer) ×4 IMPLANT
KIT TURNOVER CYSTO (KITS) ×4 IMPLANT
MARKER GOLD PRELOAD 1.2X3 (Urological Implant) ×2 IMPLANT
PACK CYSTO (CUSTOM PROCEDURE TRAY) ×4 IMPLANT
SEED GOLD PRELOAD 1.2X3 (Urological Implant) ×4 IMPLANT
SURGILUBE 2OZ TUBE FLIPTOP (MISCELLANEOUS) ×4 IMPLANT
UNDERPAD 30X30 (UNDERPADS AND DIAPERS) ×8 IMPLANT

## 2018-10-19 NOTE — Anesthesia Postprocedure Evaluation (Signed)
Anesthesia Post Note  Patient: Tony Griffin.  Procedure(s) Performed: GOLD SEED IMPLANT (N/A Prostate) SPACE OAR INSTILLATION (N/A Rectum)     Patient location during evaluation: PACU Anesthesia Type: General Level of consciousness: sedated Pain management: pain level controlled Vital Signs Assessment: post-procedure vital signs reviewed and stable Respiratory status: spontaneous breathing and respiratory function stable Cardiovascular status: stable Postop Assessment: no apparent nausea or vomiting Anesthetic complications: no    Last Vitals:  Vitals:   10/19/18 1245 10/19/18 1300  BP: 118/82 106/70  Pulse: 84 84  Resp: (!) 21 11  Temp:    SpO2: (!) 88% 94%    Last Pain:  Vitals:   10/19/18 1300  TempSrc:   PainSc: 0-No pain                 Tony Griffin

## 2018-10-19 NOTE — Discharge Instructions (Signed)

## 2018-10-19 NOTE — Transfer of Care (Signed)
Immediate Anesthesia Transfer of Care Note  Patient: Tony Griffin.  Procedure(s) Performed: GOLD SEED IMPLANT (N/A Prostate) SPACE OAR INSTILLATION (N/A Rectum)  Patient Location: PACU  Anesthesia Type:General  Level of Consciousness: awake, alert , oriented and patient cooperative  Airway & Oxygen Therapy: Patient Spontanous Breathing and Patient connected to nasal cannula oxygen  Post-op Assessment: Report given to RN and Post -op Vital signs reviewed and stable  Post vital signs: Reviewed and stable  Last Vitals:  Vitals Value Taken Time  BP 146/91 10/19/2018 12:11 PM  Temp    Pulse    Resp 22 10/19/2018 12:12 PM  SpO2    Vitals shown include unvalidated device data.  Last Pain:  Vitals:   10/19/18 0928  TempSrc:   PainSc: 0-No pain      Patients Stated Pain Goal: 8 (25/52/58 9483)  Complications: No apparent anesthesia complications

## 2018-10-19 NOTE — Anesthesia Procedure Notes (Signed)
Procedure Name: LMA Insertion Date/Time: 10/19/2018 11:37 AM Performed by: Wanita Chamberlain, CRNA Pre-anesthesia Checklist: Patient identified, Emergency Drugs available, Suction available, Patient being monitored and Timeout performed Patient Re-evaluated:Patient Re-evaluated prior to induction Oxygen Delivery Method: Circle system utilized Preoxygenation: Pre-oxygenation with 100% oxygen Induction Type: IV induction Ventilation: Mask ventilation without difficulty LMA: LMA inserted LMA Size: 5.0 Number of attempts: 1 Placement Confirmation: positive ETCO2,  CO2 detector and breath sounds checked- equal and bilateral Tube secured with: Tape Dental Injury: Teeth and Oropharynx as per pre-operative assessment

## 2018-10-19 NOTE — Anesthesia Preprocedure Evaluation (Signed)
Anesthesia Evaluation  Patient identified by MRN, date of birth, ID band Patient awake    Reviewed: Allergy & Precautions, NPO status , Patient's Chart, lab work & pertinent test results  Airway Mallampati: III  TM Distance: >3 FB Neck ROM: Full    Dental no notable dental hx. (+) Dental Advisory Given, Chipped   Pulmonary neg pulmonary ROS,    Pulmonary exam normal        Cardiovascular hypertension, Normal cardiovascular exam     Neuro/Psych negative neurological ROS  negative psych ROS   GI/Hepatic negative GI ROS, Neg liver ROS,   Endo/Other  diabetesMorbid obesity  Renal/GU negative Renal ROS  negative genitourinary   Musculoskeletal negative musculoskeletal ROS (+)   Abdominal   Peds negative pediatric ROS (+)  Hematology negative hematology ROS (+)   Anesthesia Other Findings   Reproductive/Obstetrics negative OB ROS                             Anesthesia Physical Anesthesia Plan  ASA: III  Anesthesia Plan: General   Post-op Pain Management:    Induction: Intravenous  PONV Risk Score and Plan: 3 and Ondansetron, Dexamethasone and Diphenhydramine  Airway Management Planned: LMA and Oral ETT  Additional Equipment:   Intra-op Plan:   Post-operative Plan: Extubation in OR  Informed Consent: I have reviewed the patients History and Physical, chart, labs and discussed the procedure including the risks, benefits and alternatives for the proposed anesthesia with the patient or authorized representative who has indicated his/her understanding and acceptance.   Dental advisory given  Plan Discussed with: CRNA and Anesthesiologist  Anesthesia Plan Comments:         Anesthesia Quick Evaluation

## 2018-10-19 NOTE — H&P (Signed)
Urology Preoperative H&P   Chief Complaint: Prostate cancer   History of Present Illness: Tony Griffin. is a 62 y.o. male referred by Docia Barrier, PA-C for evaluation of an elevated PSA value. He underwent a prostate biopsy on 06/23/2018 that came back positive for Gleason 7 prostate cancer in 3 out of 12 cores.  The patient recently met with Dr. Tammi Klippel and has decided to proceed with external beam radiation for primary treatment of his prostate cancer. No new voiding complaints. UA is clear. Last PSA: 8.8 (03/2018), 8.3 (04/2017). No personal/history of prostate cancer  Biopsy date: 06/23/2018  TNM stage: T1c  Gleason score: 4+3 in (2/12 cores), 3+4 (1/12 cores)  Left: Lateral and medial apex and left medial mid gland positive for Gleason 7 prostate cancer. The apical biopsies were 4+3 = 7 with 60-70% involvement. The left mid gland showed 5% involvement  Right: NED  Prostate volume: 63 cm   CT abdomen/pelvis, and bone scan (08/05/18)- no evidence of metastatic disease in either study   The patient has elected to undergo EBRT with Dr. Tammi Klippel and is here today for gold seed fiducial marker and Space OAR placement.  He states that he had a "low grade temperature" of 100F on Monday with a non productive cough.  He is afebrile today and still reports a non-productive cough.  He has been cleared by anesthesia to proceed with today's procedure.    Past Medical History:  Diagnosis Date  . At risk for sleep apnea    STOP--BANG SCORE= 6  (sent to pt's pcp 10-12-2018)  . Dyslipidemia   . HTN (hypertension)   . Hyperplasia of prostate with lower urinary tract symptoms (LUTS)   . Prostate cancer [facility]) urologist-- dr bell/  oncologist-- dr Tammi Klippel   dx 06-23-2018--- Stage T1c,  Gleason 4+3,  PSA 8.8--- plan IMRT   . Type 2 diabetes mellitus [facility])    endocrinologist-- dr Loanne Drilling  . Wears glasses     Past Surgical History:  Procedure Laterality Date  . NO PAST SURGERIES    . PROSTATE BIOPSY   06-23-2018   dr bell office    Allergies:  Allergies  Allergen Reactions  . Codeine Other (See Comments)    Heart races    Family History  Problem Relation Age of Onset  . Diabetes Brother   . Non-Hodgkin's lymphoma Father   . Breast cancer Paternal Aunt   . Breast cancer Paternal Aunt   . Prostate cancer Neg Hx   . Pancreatic cancer Neg Hx   . Colon cancer Neg Hx     Social History:  reports that he has never smoked. He has never used smokeless tobacco. He reports that he drinks alcohol. He reports that he does not use drugs.  ROS: A complete review of systems was performed.  All systems are negative except for pertinent findings as noted.  Physical Exam:  Vital signs in last 24 hours: Temp:  [98.5 F (36.9 C)] 98.5 F (36.9 C) (12/11 0858) Pulse Rate:  [95] 95 (12/11 0858) Resp:  [18] 18 (12/11 0858) BP: (126)/(77) 126/77 (12/11 0858) SpO2:  [97 %] 97 % (12/11 0858) Weight:  [145.1 kg] 145.1 kg (12/11 0858) Constitutional:  Alert and oriented, No acute distress Cardiovascular: Regular rate and rhythm, No JVD Respiratory: Normal respiratory effort, Lungs clear bilaterally GI: Abdomen is soft, nontender, nondistended, no abdominal masses GU: No CVA tenderness Lymphatic: No lymphadenopathy Neurologic: Grossly intact, no focal deficits Psychiatric: Normal mood and affect  Laboratory Data:  Recent Labs    10/19/18 0953  HGB 15.3  HCT 45.0    Recent Labs    10/19/18 0953  NA 136  K 3.8  GLUCOSE 151*     Results for orders placed or performed during the hospital encounter of 10/19/18 (from the past 24 hour(s))  I-STAT 4, (NA,K, GLUC, HGB,HCT)     Status: Abnormal   Collection Time: 10/19/18  9:53 AM  Result Value Ref Range   Sodium 136 135 - 145 mmol/L   Potassium 3.8 3.5 - 5.1 mmol/L   Glucose, Bld 151 (H) 70 - 99 mg/dL   HCT 45.0 39.0 - 52.0 %   Hemoglobin 15.3 13.0 - 17.0 g/dL   No results found for this or any previous visit (from the past 240  hour(s)).  Renal Function: No results for input(s): CREATININE in the last 168 hours. CrCl cannot be calculated (No successful lab value found.).  Radiologic Imaging: No results found.  I independently reviewed the above imaging studies.  Assessment and Plan Wlliam Grosso. is a 62 y.o. male with T1c Gleason 4+3 prostate cancer with plans to undergo EBRT as primary treatment  -The risks, benefits and alternatives of TRUSP with gold seed fiducial marker and Space OAR placement was discussed with the patient.  Risks include, but are not limited to bleeding, infection, rectal injury, hematuria, blood in the stool and/or the ejaculate, MI, CVA, DVT and PE.   Ellison Hughs, MD 10/19/2018, 10:42 AM  [facility] Pager: [phone]

## 2018-10-19 NOTE — Op Note (Signed)
Operative Note   Preoperative diagnosis:  1.  Prostate cancer   Postoperative diagnosis: 1.  Same    Procedure(s): 1.  Transrectal ultrasound guided prostatic gold seed fiducial marker and Space OAR placement   Surgeon: Ellison Hughs, MD   Assistants:  None    Anesthesia:  MAC   Complications:  None   EBL:  <5 mL   Specimens: 1. None   Drains/Catheters: 1.  None   Intraoperative findings:   1. Fiducial markers were placed at the prostatic base, mid-gland and apex.  SpaceOAR was placed with good separation of the prostate and rectum.    Indication: Mr. Serviss is a 62 y.o. male with T1c Gleason 4+3 prostate cancer that was diagnosed on 06/23/2018.  He is elected to undergo external beam radiation for primary treatment of his prostate cancer.  Patient is here today for gold fiducial and space oar placement.  The risk, benefits and alternatives were discussed preoperatively.  He voices understanding and wishes to proceed.   Description of procedure:   After informed consent the patient was brought to the major OR, placed on the table and administered general anesthesia. He was then moved to the modified lithotomy position with his perineum perpendicular to the floor. His perineum and genitalia were then sterilely prepped. An official timeout was then performed.    Real time transrectal ultrasonography was used visualize the prostate.  Gold seed fiducial markers were then placed transperineally at the prostatic base, mid gland and apex.   I then proceeded with placement of SpaceOAR by introducing a needle with the bevel angled inferiorly approximately 2 cm superior to the anus. This was angled downward and under direct ultrasound was placed within the space between the prostatic capsule and rectum. This was confirmed with a small amount of sterile saline injected and this was performed under direct ultrasound. I then attached the SpaceOAR to the needle and injected this in the  space between the prostate and rectum with good placement noted.  The patient tolerated the procedure well and was transferred to the postanesthesia in stable condition.   Plan: Follow-up in 4 months with PSA

## 2018-10-20 ENCOUNTER — Telehealth: Payer: Self-pay | Admitting: *Deleted

## 2018-10-20 NOTE — Telephone Encounter (Signed)
CALLED PATIENT TO REMIND OF SIM APPT. FOR 10-21-18, LVM FOR A RETURN CALL

## 2018-10-21 ENCOUNTER — Encounter: Payer: Self-pay | Admitting: Medical Oncology

## 2018-10-21 ENCOUNTER — Encounter (HOSPITAL_BASED_OUTPATIENT_CLINIC_OR_DEPARTMENT_OTHER): Payer: Self-pay | Admitting: Urology

## 2018-10-21 ENCOUNTER — Ambulatory Visit
Admission: RE | Admit: 2018-10-21 | Discharge: 2018-10-21 | Disposition: A | Discharge status: Home / Self Care | Payer: BLUE CROSS/BLUE SHIELD | Source: Ambulatory Visit | Attending: Radiation Oncology | Admitting: Radiation Oncology

## 2018-10-21 DIAGNOSIS — C61 Malignant neoplasm of prostate: Secondary | ICD-10-CM

## 2018-10-22 NOTE — Progress Notes (Signed)
  Radiation Oncology         [phone]) [phone] ________________________________  Name: Tony Griffin. MRN: [id number]  Date: 10/21/2018  DOB: 10-04-1956  SIMULATION AND TREATMENT PLANNING NOTE    ICD-10-CM   1. Malignant neoplasm of prostate ([place]) C61     DIAGNOSIS:  62 y.o. gentleman with unfavorable intermediate risk, Stage T1c adenocarcinoma of the prostate with Gleason Score of 4+3, and PSA of 8.8  NARRATIVE:  The patient was brought to the [place].  Identity was confirmed.  All relevant records and images related to the planned course of therapy were reviewed.  The patient freely provided informed written consent to proceed with treatment after reviewing the details related to the planned course of therapy. The consent form was witnessed and verified by the simulation staff.  Then, the patient was set-up in a stable reproducible supine position for radiation therapy.  A vacuum lock pillow device was custom fabricated to position his legs in a reproducible immobilized position.  Then, I performed a urethrogram under sterile conditions to identify the prostatic apex.  CT images were obtained.  Surface markings were placed.  The CT images were loaded into the planning software.  Then the prostate target and avoidance structures including the rectum, bladder, bowel and hips were contoured.  Treatment planning then occurred.  The radiation prescription was entered and confirmed.  A total of one complex treatment devices were fabricated. I have requested : Intensity Modulated Radiotherapy (IMRT) is medically necessary for this case for the following reason:  Rectal sparing.Marland Kitchen  PLAN:  The patient will receive 45 Gy in 25 fractions of 1.8 Gy, followed by a boost to the prostate to a total dose of 75 Gy with 15 additional fractions of 2.0 Gy.  ________________________________  Sheral Apley Tammi Klippel, M.D.

## 2018-10-28 ENCOUNTER — Ambulatory Visit (HOSPITAL_COMMUNITY): Payer: BLUE CROSS/BLUE SHIELD

## 2018-10-31 ENCOUNTER — Telehealth: Payer: Self-pay | Admitting: *Deleted

## 2018-10-31 ENCOUNTER — Encounter: Payer: Self-pay | Admitting: Radiation Oncology

## 2018-10-31 ENCOUNTER — Other Ambulatory Visit: Payer: Self-pay | Admitting: Radiation Oncology

## 2018-10-31 DIAGNOSIS — C61 Malignant neoplasm of prostate: Secondary | ICD-10-CM

## 2018-10-31 NOTE — Telephone Encounter (Signed)
CALLED PATIENT TO INFORM OF MRI FOR 11-01-18 - ARRIVAL TIME - 4:30 PM @ [facility], NO RESTRICTIONS TO TEST, LVM FOR A RETURN CALL

## 2018-10-31 NOTE — Progress Notes (Signed)
Received voicemail message from patient concerned his radiation would be delayed from 12/26 because he was unable to get MRI done to confirm Fowler placement. After conversation with Landry Corporal, PA-C, and Bryson Ha, PA-C I placed a new order for the MRI to be done at [facility] which should better accomodate the patient's 320 lb 6 ft build. Informed Romie Jumper of this. Enid Derry reports calling the patient and arranging MRI for 12/24 at 4:30 pm at [facility]. Per Romie Jumper patient understands his radiation will not be delayed and he should present as planned for initial radiation treatment on 12/26.

## 2018-11-01 ENCOUNTER — Ambulatory Visit (HOSPITAL_COMMUNITY)
Admission: RE | Admit: 2018-11-01 | Discharge: 2018-11-01 | Disposition: A | Discharge status: Home / Self Care | Payer: BLUE CROSS/BLUE SHIELD | Source: Ambulatory Visit | Attending: Urology | Admitting: Urology

## 2018-11-01 DIAGNOSIS — C61 Malignant neoplasm of prostate: Secondary | ICD-10-CM | POA: Insufficient documentation

## 2018-11-03 ENCOUNTER — Ambulatory Visit: Payer: BLUE CROSS/BLUE SHIELD

## 2018-11-03 DIAGNOSIS — E1165 Type 2 diabetes mellitus with hyperglycemia: Secondary | ICD-10-CM | POA: Diagnosis not present

## 2018-11-03 DIAGNOSIS — J40 Bronchitis, not specified as acute or chronic: Secondary | ICD-10-CM | POA: Diagnosis not present

## 2018-11-03 DIAGNOSIS — R05 Cough: Secondary | ICD-10-CM | POA: Diagnosis not present

## 2018-11-04 ENCOUNTER — Ambulatory Visit: Payer: BLUE CROSS/BLUE SHIELD

## 2018-11-07 ENCOUNTER — Ambulatory Visit: Payer: BLUE CROSS/BLUE SHIELD

## 2018-11-08 ENCOUNTER — Ambulatory Visit: Payer: BLUE CROSS/BLUE SHIELD

## 2018-11-10 ENCOUNTER — Ambulatory Visit: Payer: BLUE CROSS/BLUE SHIELD

## 2018-11-10 DIAGNOSIS — C61 Malignant neoplasm of prostate: Secondary | ICD-10-CM | POA: Insufficient documentation

## 2018-11-11 ENCOUNTER — Ambulatory Visit: Payer: BLUE CROSS/BLUE SHIELD

## 2018-11-14 ENCOUNTER — Ambulatory Visit
Admission: RE | Admit: 2018-11-14 | Discharge: 2018-11-14 | Disposition: A | Discharge status: Home / Self Care | Payer: BLUE CROSS/BLUE SHIELD | Source: Ambulatory Visit | Attending: Radiation Oncology | Admitting: Radiation Oncology

## 2018-11-14 DIAGNOSIS — C61 Malignant neoplasm of prostate: Secondary | ICD-10-CM | POA: Diagnosis not present

## 2018-11-15 ENCOUNTER — Ambulatory Visit
Admission: RE | Admit: 2018-11-15 | Discharge: 2018-11-15 | Disposition: A | Discharge status: Home / Self Care | Payer: BLUE CROSS/BLUE SHIELD | Source: Ambulatory Visit | Attending: Radiation Oncology | Admitting: Radiation Oncology

## 2018-11-15 DIAGNOSIS — J069 Acute upper respiratory infection, unspecified: Secondary | ICD-10-CM | POA: Diagnosis not present

## 2018-11-15 DIAGNOSIS — E1165 Type 2 diabetes mellitus with hyperglycemia: Secondary | ICD-10-CM | POA: Diagnosis not present

## 2018-11-15 DIAGNOSIS — C61 Malignant neoplasm of prostate: Secondary | ICD-10-CM | POA: Diagnosis not present

## 2018-11-15 DIAGNOSIS — I10 Essential (primary) hypertension: Secondary | ICD-10-CM | POA: Diagnosis not present

## 2018-11-15 DIAGNOSIS — E7800 Pure hypercholesterolemia, unspecified: Secondary | ICD-10-CM | POA: Diagnosis not present

## 2018-11-16 ENCOUNTER — Ambulatory Visit
Admission: RE | Admit: 2018-11-16 | Discharge: 2018-11-16 | Disposition: A | Discharge status: Home / Self Care | Payer: BLUE CROSS/BLUE SHIELD | Source: Ambulatory Visit | Attending: Radiation Oncology | Admitting: Radiation Oncology

## 2018-11-16 DIAGNOSIS — C61 Malignant neoplasm of prostate: Secondary | ICD-10-CM | POA: Diagnosis not present

## 2018-11-17 ENCOUNTER — Ambulatory Visit
Admission: RE | Admit: 2018-11-17 | Discharge: 2018-11-17 | Disposition: A | Discharge status: Home / Self Care | Payer: BLUE CROSS/BLUE SHIELD | Source: Ambulatory Visit | Attending: Radiation Oncology | Admitting: Radiation Oncology

## 2018-11-17 DIAGNOSIS — C61 Malignant neoplasm of prostate: Secondary | ICD-10-CM | POA: Diagnosis not present

## 2018-11-18 ENCOUNTER — Ambulatory Visit
Admission: RE | Admit: 2018-11-18 | Discharge: 2018-11-18 | Disposition: A | Discharge status: Home / Self Care | Payer: BLUE CROSS/BLUE SHIELD | Source: Ambulatory Visit | Attending: Radiation Oncology | Admitting: Radiation Oncology

## 2018-11-18 DIAGNOSIS — C61 Malignant neoplasm of prostate: Secondary | ICD-10-CM | POA: Diagnosis not present

## 2018-11-21 ENCOUNTER — Ambulatory Visit
Admission: RE | Admit: 2018-11-21 | Discharge: 2018-11-21 | Disposition: A | Discharge status: Home / Self Care | Payer: BLUE CROSS/BLUE SHIELD | Source: Ambulatory Visit | Attending: Radiation Oncology | Admitting: Radiation Oncology

## 2018-11-21 DIAGNOSIS — C61 Malignant neoplasm of prostate: Secondary | ICD-10-CM | POA: Diagnosis not present

## 2018-11-22 ENCOUNTER — Ambulatory Visit
Admission: RE | Admit: 2018-11-22 | Discharge: 2018-11-22 | Disposition: A | Discharge status: Home / Self Care | Payer: BLUE CROSS/BLUE SHIELD | Source: Ambulatory Visit | Attending: Radiation Oncology | Admitting: Radiation Oncology

## 2018-11-22 DIAGNOSIS — C61 Malignant neoplasm of prostate: Secondary | ICD-10-CM | POA: Diagnosis not present

## 2018-11-23 ENCOUNTER — Ambulatory Visit
Admission: RE | Admit: 2018-11-23 | Discharge: 2018-11-23 | Disposition: A | Discharge status: Home / Self Care | Payer: BLUE CROSS/BLUE SHIELD | Source: Ambulatory Visit | Attending: Radiation Oncology | Admitting: Radiation Oncology

## 2018-11-23 DIAGNOSIS — C61 Malignant neoplasm of prostate: Secondary | ICD-10-CM | POA: Diagnosis not present

## 2018-11-24 ENCOUNTER — Ambulatory Visit
Admission: RE | Admit: 2018-11-24 | Discharge: 2018-11-24 | Disposition: A | Discharge status: Home / Self Care | Payer: BLUE CROSS/BLUE SHIELD | Source: Ambulatory Visit | Attending: Radiation Oncology | Admitting: Radiation Oncology

## 2018-11-24 DIAGNOSIS — C61 Malignant neoplasm of prostate: Secondary | ICD-10-CM | POA: Diagnosis not present

## 2018-11-25 ENCOUNTER — Ambulatory Visit
Admission: RE | Admit: 2018-11-25 | Discharge: 2018-11-25 | Disposition: A | Discharge status: Home / Self Care | Payer: BLUE CROSS/BLUE SHIELD | Source: Ambulatory Visit | Attending: Radiation Oncology | Admitting: Radiation Oncology

## 2018-11-25 DIAGNOSIS — C61 Malignant neoplasm of prostate: Secondary | ICD-10-CM | POA: Diagnosis not present

## 2018-11-28 ENCOUNTER — Ambulatory Visit
Admission: RE | Admit: 2018-11-28 | Discharge: 2018-11-28 | Disposition: A | Discharge status: Home / Self Care | Payer: BLUE CROSS/BLUE SHIELD | Source: Ambulatory Visit | Attending: Radiation Oncology | Admitting: Radiation Oncology

## 2018-11-28 DIAGNOSIS — C61 Malignant neoplasm of prostate: Secondary | ICD-10-CM | POA: Diagnosis not present

## 2018-11-29 ENCOUNTER — Ambulatory Visit
Admission: RE | Admit: 2018-11-29 | Discharge: 2018-11-29 | Disposition: A | Discharge status: Home / Self Care | Payer: BLUE CROSS/BLUE SHIELD | Source: Ambulatory Visit | Attending: Radiation Oncology | Admitting: Radiation Oncology

## 2018-11-29 ENCOUNTER — Encounter: Payer: Self-pay | Admitting: Medical Oncology

## 2018-11-29 DIAGNOSIS — C61 Malignant neoplasm of prostate: Secondary | ICD-10-CM | POA: Diagnosis not present

## 2018-11-29 NOTE — Progress Notes (Signed)
[name] states radiation is going well and he has no major side effects. He states he is very happy things are going well.

## 2018-11-30 ENCOUNTER — Ambulatory Visit
Admission: RE | Admit: 2018-11-30 | Discharge: 2018-11-30 | Disposition: A | Discharge status: Home / Self Care | Payer: BLUE CROSS/BLUE SHIELD | Source: Ambulatory Visit | Attending: Radiation Oncology | Admitting: Radiation Oncology

## 2018-11-30 DIAGNOSIS — C61 Malignant neoplasm of prostate: Secondary | ICD-10-CM | POA: Diagnosis not present

## 2018-12-01 ENCOUNTER — Ambulatory Visit
Admission: RE | Admit: 2018-12-01 | Discharge: 2018-12-01 | Disposition: A | Discharge status: Home / Self Care | Payer: BLUE CROSS/BLUE SHIELD | Source: Ambulatory Visit | Attending: Radiation Oncology | Admitting: Radiation Oncology

## 2018-12-01 DIAGNOSIS — C61 Malignant neoplasm of prostate: Secondary | ICD-10-CM | POA: Diagnosis not present

## 2018-12-02 ENCOUNTER — Ambulatory Visit
Admission: RE | Admit: 2018-12-02 | Discharge: 2018-12-02 | Disposition: A | Discharge status: Home / Self Care | Payer: BLUE CROSS/BLUE SHIELD | Source: Ambulatory Visit | Attending: Radiation Oncology | Admitting: Radiation Oncology

## 2018-12-02 DIAGNOSIS — C61 Malignant neoplasm of prostate: Secondary | ICD-10-CM | POA: Diagnosis not present

## 2018-12-05 ENCOUNTER — Ambulatory Visit
Admission: RE | Admit: 2018-12-05 | Discharge: 2018-12-05 | Disposition: A | Discharge status: Home / Self Care | Payer: BLUE CROSS/BLUE SHIELD | Source: Ambulatory Visit | Attending: Radiation Oncology | Admitting: Radiation Oncology

## 2018-12-05 DIAGNOSIS — C61 Malignant neoplasm of prostate: Secondary | ICD-10-CM | POA: Diagnosis not present

## 2018-12-06 ENCOUNTER — Ambulatory Visit
Admission: RE | Admit: 2018-12-06 | Discharge: 2018-12-06 | Disposition: A | Discharge status: Home / Self Care | Payer: BLUE CROSS/BLUE SHIELD | Source: Ambulatory Visit | Attending: Radiation Oncology | Admitting: Radiation Oncology

## 2018-12-06 DIAGNOSIS — C61 Malignant neoplasm of prostate: Secondary | ICD-10-CM | POA: Diagnosis not present

## 2018-12-07 ENCOUNTER — Ambulatory Visit
Admission: RE | Admit: 2018-12-07 | Discharge: 2018-12-07 | Disposition: A | Discharge status: Home / Self Care | Payer: BLUE CROSS/BLUE SHIELD | Source: Ambulatory Visit | Attending: Radiation Oncology | Admitting: Radiation Oncology

## 2018-12-07 DIAGNOSIS — C61 Malignant neoplasm of prostate: Secondary | ICD-10-CM | POA: Diagnosis not present

## 2018-12-08 ENCOUNTER — Ambulatory Visit
Admission: RE | Admit: 2018-12-08 | Discharge: 2018-12-08 | Disposition: A | Discharge status: Home / Self Care | Payer: BLUE CROSS/BLUE SHIELD | Source: Ambulatory Visit | Attending: Radiation Oncology | Admitting: Radiation Oncology

## 2018-12-08 DIAGNOSIS — C61 Malignant neoplasm of prostate: Secondary | ICD-10-CM | POA: Diagnosis not present

## 2018-12-09 ENCOUNTER — Ambulatory Visit
Admission: RE | Admit: 2018-12-09 | Discharge: 2018-12-09 | Disposition: A | Discharge status: Home / Self Care | Payer: BLUE CROSS/BLUE SHIELD | Source: Ambulatory Visit | Attending: Radiation Oncology | Admitting: Radiation Oncology

## 2018-12-09 ENCOUNTER — Ambulatory Visit: Payer: BLUE CROSS/BLUE SHIELD | Admitting: Endocrinology

## 2018-12-09 DIAGNOSIS — C61 Malignant neoplasm of prostate: Secondary | ICD-10-CM | POA: Diagnosis not present

## 2018-12-12 ENCOUNTER — Encounter: Payer: Self-pay | Admitting: Medical Oncology

## 2018-12-12 ENCOUNTER — Ambulatory Visit
Admission: RE | Admit: 2018-12-12 | Discharge: 2018-12-12 | Disposition: A | Discharge status: Home / Self Care | Payer: BLUE CROSS/BLUE SHIELD | Source: Ambulatory Visit | Attending: Radiation Oncology | Admitting: Radiation Oncology

## 2018-12-12 ENCOUNTER — Ambulatory Visit (INDEPENDENT_AMBULATORY_CARE_PROVIDER_SITE_OTHER): Payer: BLUE CROSS/BLUE SHIELD | Admitting: Endocrinology

## 2018-12-12 ENCOUNTER — Encounter: Payer: Self-pay | Admitting: Endocrinology

## 2018-12-12 VITALS — BP 128/80 | HR 95 | Ht 72.0 in | Wt 318.4 lb

## 2018-12-12 DIAGNOSIS — E119 Type 2 diabetes mellitus without complications: Secondary | ICD-10-CM | POA: Diagnosis not present

## 2018-12-12 DIAGNOSIS — C61 Malignant neoplasm of prostate: Secondary | ICD-10-CM | POA: Diagnosis not present

## 2018-12-12 LAB — POCT GLYCOSYLATED HEMOGLOBIN (HGB A1C): Hemoglobin A1C: 6.6 % — AB (ref 4.0–5.6)

## 2018-12-12 NOTE — Progress Notes (Signed)
Mr. Fulco states treatments are going well with no major issues.

## 2018-12-12 NOTE — Progress Notes (Signed)
Subjective:    Patient ID: Tony Bogus., male    DOB: 01/19/1956, 63 y.o.   MRN: [id number]  HPI Pt returns for f/u of diabetes mellitus: DM type: 2 Dx'ed: 8242 Complications: none Therapy: Ozempic and 2 oral meds.   DKA: never Severe hypoglycemia: never Pancreatitis: never Pancreatic imaging: never Other: he has never been on insulin; he declines weight loss surgery.   Interval history: pt states he feels well in general. He says cbg's are well-controlled.   Past Medical History:  Diagnosis Date  . At risk for sleep apnea    STOP--BANG SCORE= 6  (sent to pt's pcp 10-12-2018)  . Dyslipidemia   . HTN (hypertension)   . Hyperplasia of prostate with lower urinary tract symptoms (LUTS)   . Prostate cancer [facility]) urologist-- dr bell/  oncologist-- dr Tammi Klippel   dx 06-23-2018--- Stage T1c,  Gleason 4+3,  PSA 8.8--- plan IMRT   . Type 2 diabetes mellitus [facility])    endocrinologist-- dr Loanne Drilling  . Wears glasses     Past Surgical History:  Procedure Laterality Date  . GOLD SEED IMPLANT N/A 10/19/2018   Procedure: GOLD SEED IMPLANT;  Surgeon: Ceasar Mons, MD;  Location: [facility];  Service: Urology;  Laterality: N/A;  . NO PAST SURGERIES    . PROSTATE BIOPSY  06-23-2018   dr bell office  . SPACE OAR INSTILLATION N/A 10/19/2018   Procedure: SPACE OAR INSTILLATION;  Surgeon: Ceasar Mons, MD;  Location: [facility];  Service: Urology;  Laterality: N/A;    Social History   Socioeconomic History  . Marital status: Married    Spouse name: [name]  . Number of children: 1  . Years of education: Not on file  . Highest education level: Not on file  Occupational History  . Not on file  Social Needs  . Financial resource strain: Not on file  . Food insecurity:    Worry: Not on file    Inability: Not on file  . Transportation needs:    Medical: Not on file    Non-medical: Not on file  Tobacco Use  . Smoking status: Never  Smoker  . Smokeless tobacco: Never Used  Substance and Sexual Activity  . Alcohol use: Yes    Comment: occasional  . Drug use: Never  . Sexual activity: Yes    Partners: Female    Comment: vasectomy-- 2000  Lifestyle  . Physical activity:    Days per week: Not on file    Minutes per session: Not on file  . Stress: Not on file  Relationships  . Social connections:    Talks on phone: Not on file    Gets together: Not on file    Attends religious service: Not on file    Active member of club or organization: Not on file    Attends meetings of clubs or organizations: Not on file    Relationship status: Not on file  . Intimate partner violence:    Fear of current or ex partner: Not on file    Emotionally abused: Not on file    Physically abused: Not on file    Forced sexual activity: Not on file  Other Topics Concern  . Not on file  Social History Narrative  . Not on file    Current Outpatient Medications on File Prior to Visit  Medication Sig Dispense Refill  . Cholecalciferol (VITAMIN D-3 PO) Take 1 capsule by mouth daily.    Marland Kitchen  dapagliflozin propanediol (FARXIGA) 5 MG TABS tablet Take 5 mg by mouth daily. (Patient taking differently: Take 5 mg by mouth every morning. ) 30 tablet 11  . glucose blood (CONTOUR TEST) test strip 1 each by Other route daily. And lancets 1/day 100 each 3  . lisinopril (PRINIVIL,ZESTRIL) 10 MG tablet Take 10 mg by mouth every evening.     Marland Kitchen MAGNESIUM PO Take 1 tablet by mouth daily.    . metFORMIN (GLUCOPHAGE) 500 MG tablet Take 500 mg by mouth 2 (two) times daily.   3  . omeprazole (PRILOSEC) 40 MG capsule Take 40 mg by mouth every evening.     . rosuvastatin (CRESTOR) 10 MG tablet Take 10 mg by mouth every evening.     . Semaglutide (OZEMPIC) 1 MG/DOSE SOPN Inject 1 mg into the skin once a week. (Patient taking differently: Inject 1 mg into the skin once a week. sunday's) 4 pen 11   No current facility-administered medications on file prior to  visit.     Allergies  Allergen Reactions  . Codeine Other (See Comments)    Heart races    Family History  Problem Relation Age of Onset  . Diabetes Brother   . Non-Hodgkin's lymphoma Father   . Breast cancer Paternal Aunt   . Breast cancer Paternal Aunt   . Prostate cancer Neg Hx   . Pancreatic cancer Neg Hx   . Colon cancer Neg Hx     BP 128/80 (BP Location: Left Arm, Patient Position: Sitting, Cuff Size: Large)   Pulse 95   Ht 6' (1.829 m)   Wt (!) 318 lb 6.4 oz (144.4 kg)   SpO2 94%   BMI 43.18 kg/m    Review of Systems He has lost 25 lbs x 6 months (he attributes to Ozempic).     Objective:   Physical Exam VITAL SIGNS:  See vs page GENERAL: no distress Pulses: dorsalis pedis intact bilat.   MSK: no deformity of the feet CV: 1+ bilat leg edema Skin:  no ulcer on the feet, but the skin is dry.  normal color and temp on the feet. Neuro: sensation is intact to touch on the feet Ext: There is bilateral onychomycosis of the toenails.     Lab Results  Component Value Date   HGBA1C 6.6 (A) 12/12/2018   Lab Results  Component Value Date   NA 136 10/19/2018   K 3.8 10/19/2018      Assessment & Plan:  Type 2 DM: well-controlled Edema: this limits rx options weight loss: pt is advised to continue his efforts.  Please continue the same Ozempic.   Patient Instructions  Please continue the same diabetes medications. check your blood sugar once a day.  vary the time of day when you check, between before the 3 meals, and at bedtime.  also check if you have symptoms of your blood sugar being too high or too low.  please keep a record of the readings and bring it to your next appointment here (or you can bring the meter itself).  You can write it on any piece of paper.  please call us sooner if your blood sugar goes below 70, or if you have a lot of readings over 200.  Please come back for a follow-up appointment in 4-5 months.

## 2018-12-12 NOTE — Patient Instructions (Addendum)
Please continue the same diabetes medications. check your blood sugar once a day.  vary the time of day when you check, between before the 3 meals, and at bedtime.  also check if you have symptoms of your blood sugar being too high or too low.  please keep a record of the readings and bring it to your next appointment here (or you can bring the meter itself).  You can write it on any piece of paper.  please call us sooner if your blood sugar goes below 70, or if you have a lot of readings over 200.  Please come back for a follow-up appointment in 4-5 months.    

## 2018-12-13 ENCOUNTER — Ambulatory Visit: Payer: BLUE CROSS/BLUE SHIELD

## 2018-12-13 ENCOUNTER — Ambulatory Visit
Admission: RE | Admit: 2018-12-13 | Discharge: 2018-12-13 | Disposition: A | Discharge status: Home / Self Care | Payer: BLUE CROSS/BLUE SHIELD | Source: Ambulatory Visit | Attending: Radiation Oncology | Admitting: Radiation Oncology

## 2018-12-13 DIAGNOSIS — C61 Malignant neoplasm of prostate: Secondary | ICD-10-CM | POA: Diagnosis not present

## 2018-12-14 ENCOUNTER — Ambulatory Visit
Admission: RE | Admit: 2018-12-14 | Discharge: 2018-12-14 | Disposition: A | Discharge status: Home / Self Care | Payer: BLUE CROSS/BLUE SHIELD | Source: Ambulatory Visit | Attending: Radiation Oncology | Admitting: Radiation Oncology

## 2018-12-14 DIAGNOSIS — C61 Malignant neoplasm of prostate: Secondary | ICD-10-CM | POA: Diagnosis not present

## 2018-12-15 ENCOUNTER — Ambulatory Visit
Admission: RE | Admit: 2018-12-15 | Discharge: 2018-12-15 | Disposition: A | Discharge status: Home / Self Care | Payer: BLUE CROSS/BLUE SHIELD | Source: Ambulatory Visit | Attending: Radiation Oncology | Admitting: Radiation Oncology

## 2018-12-15 DIAGNOSIS — C61 Malignant neoplasm of prostate: Secondary | ICD-10-CM | POA: Diagnosis not present

## 2018-12-16 ENCOUNTER — Ambulatory Visit
Admission: RE | Admit: 2018-12-16 | Discharge: 2018-12-16 | Disposition: A | Discharge status: Home / Self Care | Payer: BLUE CROSS/BLUE SHIELD | Source: Ambulatory Visit | Attending: Radiation Oncology | Admitting: Radiation Oncology

## 2018-12-16 DIAGNOSIS — C61 Malignant neoplasm of prostate: Secondary | ICD-10-CM | POA: Diagnosis not present

## 2018-12-19 ENCOUNTER — Ambulatory Visit
Admission: RE | Admit: 2018-12-19 | Discharge: 2018-12-19 | Disposition: A | Discharge status: Home / Self Care | Payer: BLUE CROSS/BLUE SHIELD | Source: Ambulatory Visit | Attending: Radiation Oncology | Admitting: Radiation Oncology

## 2018-12-19 DIAGNOSIS — C61 Malignant neoplasm of prostate: Secondary | ICD-10-CM | POA: Diagnosis not present

## 2018-12-20 ENCOUNTER — Ambulatory Visit
Admission: RE | Admit: 2018-12-20 | Discharge: 2018-12-20 | Disposition: A | Discharge status: Home / Self Care | Payer: BLUE CROSS/BLUE SHIELD | Source: Ambulatory Visit | Attending: Radiation Oncology | Admitting: Radiation Oncology

## 2018-12-20 DIAGNOSIS — C61 Malignant neoplasm of prostate: Secondary | ICD-10-CM | POA: Diagnosis not present

## 2018-12-21 ENCOUNTER — Ambulatory Visit: Payer: BLUE CROSS/BLUE SHIELD

## 2018-12-21 ENCOUNTER — Ambulatory Visit
Admission: RE | Admit: 2018-12-21 | Discharge: 2018-12-21 | Disposition: A | Discharge status: Home / Self Care | Payer: BLUE CROSS/BLUE SHIELD | Source: Ambulatory Visit | Attending: Radiation Oncology | Admitting: Radiation Oncology

## 2018-12-21 DIAGNOSIS — C61 Malignant neoplasm of prostate: Secondary | ICD-10-CM | POA: Diagnosis not present

## 2018-12-23 ENCOUNTER — Encounter: Payer: Self-pay | Admitting: Radiation Oncology

## 2018-12-23 NOTE — Progress Notes (Signed)
  Radiation Oncology         [phone]) [id number] ________________________________  Name: Tony Griffin. MRN: [id number]  Date: 12/23/2018  DOB: [birth year]  End of Treatment Note  Diagnosis:   63 y.o.gentleman with unfavorable intermediaterisk, Stage T1cadenocarcinoma of the prostate with Gleason Score of 4+3, and PSA of8.8     Indication for treatment:  Curative, Definitive Radiotherapy       Radiation treatment dates:   11/14/2018 - 12/21/2018  Site/dose:   The prostate was treated to 70 Gy in 28 fractions of 2.5 Gy  Beams/energy:   The patient was treated with IMRT using volumetric arc therapy delivering 6 MV X-rays to clockwise and counterclockwise circumferential arcs with a 90 degree collimator offset to avoid dose scalloping.  Image guidance was performed with daily cone beam CT prior to each fraction to align to gold markers in the prostate and assure proper bladder and rectal fill volumes.  Immobilization was achieved with BodyFix custom mold.  Narrative: The patient tolerated radiation treatment relatively well. He reported improved nocturia and bowel movements after starting radiation. He denied hematuria throughout treatment but did experience mild dysuria, incomplete bladder emptying, urgency, leakage, nocturia up to x5, and fatigue at times throughout treatments.    Plan: The patient has completed radiation treatment. He will return to radiation oncology clinic for routine followup in one month. I advised him to call or return sooner if he has any questions or concerns related to his recovery or treatment. ________________________________  Sheral Apley. Tammi Klippel, M.D.  This document serves as a record of services personally performed by Tyler Pita, MD. It was created on his behalf by Wilburn Mylar, a trained medical scribe. The creation of this record is based on the scribe's personal observations and the provider's statements to them. This document has been checked and approved by  the attending provider.

## 2019-01-19 ENCOUNTER — Encounter: Payer: Self-pay | Admitting: Urology

## 2019-01-19 ENCOUNTER — Ambulatory Visit: Payer: BLUE CROSS/BLUE SHIELD | Admitting: Urology

## 2019-01-19 NOTE — Progress Notes (Signed)
I called and spoke with Tony Griffin by phone this morning to save him a visit out to the hospital for his routine 1 month follow-up visit given the concerns around the COVID-19 virus.  He reports that he is doing quite well, now approximately 4 weeks out from completion of prostate IMRT.  He is noticing gradual improvement in his LUTS with his only complaint being mild urgency with small volume, dribbling leakage at times.  His nocturia remains 2x/night which is his baseline.  He specifically denies dysuria, gross hematuria, excessive daytime frequency, straining to void or incomplete bladder emptying.  He denies any significant impact on his energy level as he has been able to remain quite active.  He reports a healthy appetite and is maintaining his weight.  He denies any abdominal pain, nausea, vomiting, diarrhea or constipation.  Overall, he is quite pleased with his progress to date.  We discussed that while we are happy to continue to participate in his care if clinically indicated, at this point, we will plan to see him back on an as-needed basis.  He will continue in routine follow-up under the care and direction of his urologist, Dr. Lovena Neighbours at [facility] urology.  He does not currently have a scheduled follow-up visit but anticipates a return visit in May 2020 for a repeat PSA at that time.  He understands what to expect with the PSA going forward.  We will remain in close communication with Dr. Lovena Neighbours and look forward to continuing to follow his progress via correspondence.  He appears to have a good understanding of his disease and our recommendations and is in agreement with the stated plan.  He knows to call at anytime with any questions or concerns related to his previous radiotherapy.   [name], MMS, PA-C [place] [facility]: [phone]  Fax: [phone]

## 2019-03-17 DIAGNOSIS — C61 Malignant neoplasm of prostate: Secondary | ICD-10-CM | POA: Diagnosis not present

## 2019-03-21 ENCOUNTER — Encounter: Payer: Self-pay | Admitting: *Deleted

## 2019-05-04 ENCOUNTER — Other Ambulatory Visit: Payer: Self-pay

## 2019-05-08 ENCOUNTER — Ambulatory Visit: Payer: BC Managed Care – PPO | Admitting: Endocrinology

## 2019-05-08 ENCOUNTER — Other Ambulatory Visit: Payer: Self-pay

## 2019-05-08 ENCOUNTER — Encounter: Payer: Self-pay | Admitting: Endocrinology

## 2019-05-08 VITALS — BP 142/80 | HR 93 | Ht 72.0 in | Wt 315.4 lb

## 2019-05-08 DIAGNOSIS — E119 Type 2 diabetes mellitus without complications: Secondary | ICD-10-CM

## 2019-05-08 LAB — POCT GLYCOSYLATED HEMOGLOBIN (HGB A1C): Hemoglobin A1C: 6.3 % — AB (ref 4.0–5.6)

## 2019-05-08 MED ORDER — OZEMPIC (1 MG/DOSE) 2 MG/1.5ML ~~LOC~~ SOPN: 1.0000 mg | PEN_INJECTOR | SUBCUTANEOUS | 11 refills | Status: DC

## 2019-05-08 MED ORDER — FARXIGA 5 MG PO TABS: 5.0000 mg | ORAL_TABLET | Freq: Every day | ORAL | 11 refills | Status: DC

## 2019-05-08 NOTE — Progress Notes (Signed)
ae

## 2019-05-08 NOTE — Patient Instructions (Signed)
Please continue the same diabetes medications. check your blood sugar once a day.  vary the time of day when you check, between before the 3 meals, and at bedtime.  also check if you have symptoms of your blood sugar being too high or too low.  please keep a record of the readings and bring it to your next appointment here (or you can bring the meter itself).  You can write it on any piece of paper.  please call us sooner if your blood sugar goes below 70, or if you have a lot of readings over 200.  Please come back for a follow-up appointment in 4-5 months.

## 2019-05-08 NOTE — Progress Notes (Signed)
Subjective:    Patient ID: Tony Bogus., male    DOB: 21-Aug-1956, 63 y.o.   MRN: [id number]  HPI Pt returns for f/u of diabetes mellitus: DM type: 2 Dx'ed: 1962 Complications: PN Therapy: Ozempic and 2 oral meds.   DKA: never Severe hypoglycemia: never Pancreatitis: never Pancreatic imaging: never Other: he has never been on insulin; he declines weight loss surgery.   Interval history: pt states he feels well in general. He says cbg's are well-controlled.   Past Medical History:  Diagnosis Date  . At risk for sleep apnea    STOP--BANG SCORE= 6  (sent to pt's pcp 10-12-2018)  . Dyslipidemia   . HTN (hypertension)   . Hyperplasia of prostate with lower urinary tract symptoms (LUTS)   . Prostate cancer [facility]) urologist-- dr bell/  oncologist-- dr Tammi Klippel   dx 06-23-2018--- Stage T1c,  Gleason 4+3,  PSA 8.8--- plan IMRT   . Type 2 diabetes mellitus [facility])    endocrinologist-- dr Loanne Drilling  . Wears glasses     Past Surgical History:  Procedure Laterality Date  . GOLD SEED IMPLANT N/A 10/19/2018   Procedure: GOLD SEED IMPLANT;  Surgeon: Ceasar Mons, MD;  Location: [facility];  Service: Urology;  Laterality: N/A;  . NO PAST SURGERIES    . PROSTATE BIOPSY  06-23-2018   dr bell office  . SPACE OAR INSTILLATION N/A 10/19/2018   Procedure: SPACE OAR INSTILLATION;  Surgeon: Ceasar Mons, MD;  Location: [facility];  Service: Urology;  Laterality: N/A;    Social History   Socioeconomic History  . Marital status: Married    Spouse name: [name]  . Number of children: 1  . Years of education: Not on file  . Highest education level: Not on file  Occupational History  . Not on file  Social Needs  . Financial resource strain: Not on file  . Food insecurity    Worry: Not on file    Inability: Not on file  . Transportation needs    Medical: Not on file    Non-medical: Not on file  Tobacco Use  . Smoking status: Never Smoker   . Smokeless tobacco: Never Used  Substance and Sexual Activity  . Alcohol use: Yes    Comment: occasional  . Drug use: Never  . Sexual activity: Yes    Partners: Female    Comment: vasectomy-- 2000  Lifestyle  . Physical activity    Days per week: Not on file    Minutes per session: Not on file  . Stress: Not on file  Relationships  . Social Herbalist on phone: Not on file    Gets together: Not on file    Attends religious service: Not on file    Active member of club or organization: Not on file    Attends meetings of clubs or organizations: Not on file    Relationship status: Not on file  . Intimate partner violence    Fear of current or ex partner: Not on file    Emotionally abused: Not on file    Physically abused: Not on file    Forced sexual activity: Not on file  Other Topics Concern  . Not on file  Social History Narrative  . Not on file    Current Outpatient Medications on File Prior to Visit  Medication Sig Dispense Refill  . Cholecalciferol (VITAMIN D-3 PO) Take 1 capsule by mouth daily.    Marland Kitchen  glucose blood (CONTOUR TEST) test strip 1 each by Other route daily. And lancets 1/day 100 each 3  . lisinopril (PRINIVIL,ZESTRIL) 10 MG tablet Take 10 mg by mouth every evening.     Marland Kitchen MAGNESIUM PO Take 1 tablet by mouth daily.    . metFORMIN (GLUCOPHAGE) 500 MG tablet Take 500 mg by mouth 2 (two) times daily.   3  . omeprazole (PRILOSEC) 40 MG capsule Take 40 mg by mouth every evening.     . rosuvastatin (CRESTOR) 10 MG tablet Take 10 mg by mouth every evening.      No current facility-administered medications on file prior to visit.     Allergies  Allergen Reactions  . Codeine Other (See Comments)    Heart races    Family History  Problem Relation Age of Onset  . Diabetes Brother   . Non-Hodgkin's lymphoma Father   . Breast cancer Paternal Aunt   . Breast cancer Paternal Aunt   . Prostate cancer Neg Hx   . Pancreatic cancer Neg Hx   . Colon  cancer Neg Hx     BP (!) 142/80 (BP Location: Left Arm, Patient Position: Sitting, Cuff Size: Large)   Pulse 93   Ht 6' (1.829 m)   Wt (!) 315 lb 6.4 oz (143.1 kg)   SpO2 97%   BMI 42.78 kg/m    Review of Systems He denies hypoglycemia.  He has lost a few lbs.      Objective:   Physical Exam VITAL SIGNS:  See vs page GENERAL: no distress Pulses: dorsalis pedis intact bilat.   MSK: no deformity of the feet CV: no leg edema Skin:  no ulcer on the feet.  normal color and temp on the feet. Neuro: sensation is intact to touch on the feet, but decreased from normal.     Lab Results  Component Value Date   HGBA1C 6.3 (A) 05/08/2019       Assessment & Plan:  Type 2 DM, with PN: well-controlled Obesity, persistent: I encouraged pt to continue his efforts  Patient Instructions  Please continue the same diabetes medications. check your blood sugar once a day.  vary the time of day when you check, between before the 3 meals, and at bedtime.  also check if you have symptoms of your blood sugar being too high or too low.  please keep a record of the readings and bring it to your next appointment here (or you can bring the meter itself).  You can write it on any piece of paper.  please call us sooner if your blood sugar goes below 70, or if you have a lot of readings over 200.  Please come back for a follow-up appointment in 4-5 months.

## 2019-06-16 DIAGNOSIS — C61 Malignant neoplasm of prostate: Secondary | ICD-10-CM | POA: Diagnosis not present

## 2019-06-20 ENCOUNTER — Encounter: Payer: Self-pay | Admitting: *Deleted

## 2019-06-20 DIAGNOSIS — E559 Vitamin D deficiency, unspecified: Secondary | ICD-10-CM | POA: Diagnosis not present

## 2019-06-20 DIAGNOSIS — R5383 Other fatigue: Secondary | ICD-10-CM | POA: Diagnosis not present

## 2019-06-20 DIAGNOSIS — Z0000 Encounter for general adult medical examination without abnormal findings: Secondary | ICD-10-CM | POA: Diagnosis not present

## 2019-06-20 DIAGNOSIS — Z1159 Encounter for screening for other viral diseases: Secondary | ICD-10-CM | POA: Diagnosis not present

## 2019-06-20 DIAGNOSIS — E1165 Type 2 diabetes mellitus with hyperglycemia: Secondary | ICD-10-CM | POA: Diagnosis not present

## 2019-06-20 DIAGNOSIS — Z125 Encounter for screening for malignant neoplasm of prostate: Secondary | ICD-10-CM | POA: Diagnosis not present

## 2019-07-21 ENCOUNTER — Other Ambulatory Visit: Payer: Self-pay | Admitting: Endocrinology

## 2019-08-20 ENCOUNTER — Other Ambulatory Visit: Payer: Self-pay | Admitting: Endocrinology

## 2019-08-21 ENCOUNTER — Other Ambulatory Visit: Payer: Self-pay | Admitting: Endocrinology

## 2019-09-15 DIAGNOSIS — C61 Malignant neoplasm of prostate: Secondary | ICD-10-CM | POA: Diagnosis not present

## 2019-09-22 DIAGNOSIS — C61 Malignant neoplasm of prostate: Secondary | ICD-10-CM | POA: Diagnosis not present

## 2019-09-22 DIAGNOSIS — R351 Nocturia: Secondary | ICD-10-CM | POA: Diagnosis not present

## 2019-09-22 DIAGNOSIS — N401 Enlarged prostate with lower urinary tract symptoms: Secondary | ICD-10-CM | POA: Diagnosis not present

## 2019-10-04 ENCOUNTER — Other Ambulatory Visit: Payer: Self-pay

## 2019-10-09 ENCOUNTER — Encounter: Payer: Self-pay | Admitting: Endocrinology

## 2019-10-09 ENCOUNTER — Ambulatory Visit: Payer: BC Managed Care – PPO | Admitting: Endocrinology

## 2019-10-09 VITALS — BP 128/70 | HR 84 | Ht 72.0 in | Wt 316.0 lb

## 2019-10-09 DIAGNOSIS — E1142 Type 2 diabetes mellitus with diabetic polyneuropathy: Secondary | ICD-10-CM | POA: Diagnosis not present

## 2019-10-09 DIAGNOSIS — R609 Edema, unspecified: Secondary | ICD-10-CM | POA: Diagnosis not present

## 2019-10-09 DIAGNOSIS — E119 Type 2 diabetes mellitus without complications: Secondary | ICD-10-CM | POA: Diagnosis not present

## 2019-10-09 LAB — POCT GLYCOSYLATED HEMOGLOBIN (HGB A1C): Hemoglobin A1C: 5.9 % — AB (ref 4.0–5.6)

## 2019-10-09 NOTE — Progress Notes (Signed)
Subjective:    Patient ID: Tony Griffin., male    DOB: 29-Dec-1955, 63 y.o.   MRN: [id number]  HPI Pt returns for f/u of diabetes mellitus:   DM type: 2 Dx'ed: 0000000 Complications: PN Therapy: Ozempic and 2 oral meds.   DKA: never Severe hypoglycemia: never Pancreatitis: never Pancreatic imaging: never Other: he has never been on insulin; he declines weight loss surgery.   Interval history: pt states he feels well in general, except numbness of the feet is worse.  He says cbg's are well-controlled.   Past Medical History:  Diagnosis Date  . At risk for sleep apnea    STOP--BANG SCORE= 6  (sent to pt's pcp 10-12-2018)  . Dyslipidemia   . HTN (hypertension)   . Hyperplasia of prostate with lower urinary tract symptoms (LUTS)   . Prostate cancer [facility]) urologist-- dr bell/  oncologist-- dr Tammi Klippel   dx 06-23-2018--- Stage T1c,  Gleason 4+3,  PSA 8.8--- plan IMRT   . Type 2 diabetes mellitus [facility])    endocrinologist-- dr Loanne Drilling  . Wears glasses     Past Surgical History:  Procedure Laterality Date  . GOLD SEED IMPLANT N/A 10/19/2018   Procedure: GOLD SEED IMPLANT;  Surgeon: Ceasar Mons, MD;  Location: [facility];  Service: Urology;  Laterality: N/A;  . NO PAST SURGERIES    . PROSTATE BIOPSY  06-23-2018   dr bell office  . SPACE OAR INSTILLATION N/A 10/19/2018   Procedure: SPACE OAR INSTILLATION;  Surgeon: Ceasar Mons, MD;  Location: [facility];  Service: Urology;  Laterality: N/A;    Social History   Socioeconomic History  . Marital status: Married    Spouse name: [name]  . Number of children: 1  . Years of education: Not on file  . Highest education level: Not on file  Occupational History  . Not on file  Social Needs  . Financial resource strain: Not on file  . Food insecurity    Worry: Not on file    Inability: Not on file  . Transportation needs    Medical: Not on file    Non-medical: Not on file   Tobacco Use  . Smoking status: Never Smoker  . Smokeless tobacco: Never Used  Substance and Sexual Activity  . Alcohol use: Yes    Comment: occasional  . Drug use: Never  . Sexual activity: Yes    Partners: Female    Comment: vasectomy-- 2000  Lifestyle  . Physical activity    Days per week: Not on file    Minutes per session: Not on file  . Stress: Not on file  Relationships  . Social Herbalist on phone: Not on file    Gets together: Not on file    Attends religious service: Not on file    Active member of club or organization: Not on file    Attends meetings of clubs or organizations: Not on file    Relationship status: Not on file  . Intimate partner violence    Fear of current or ex partner: Not on file    Emotionally abused: Not on file    Physically abused: Not on file    Forced sexual activity: Not on file  Other Topics Concern  . Not on file  Social History Narrative  . Not on file    Current Outpatient Medications on File Prior to Visit  Medication Sig Dispense Refill  . Cholecalciferol (VITAMIN  D-3 PO) Take 1 capsule by mouth daily.    Marland Kitchen FARXIGA 5 MG TABS tablet TAKE 1 TABLET BY MOUTH EVERY DAY 30 tablet 11  . glucose blood (CONTOUR TEST) test strip 1 each by Other route daily. And lancets 1/day 100 each 3  . lisinopril (PRINIVIL,ZESTRIL) 10 MG tablet Take 10 mg by mouth every evening.     Marland Kitchen MAGNESIUM PO Take 1 tablet by mouth daily.    . metFORMIN (GLUCOPHAGE) 500 MG tablet Take 500 mg by mouth 2 (two) times daily.   3  . omeprazole (PRILOSEC) 40 MG capsule Take 40 mg by mouth every evening.     Marland Kitchen OZEMPIC, 1 MG/DOSE, 2 MG/1.5ML SOPN INJECT 1 MG INTO THE SKIN ONCE A WEEK. 1 pen 11  . rosuvastatin (CRESTOR) 10 MG tablet Take 10 mg by mouth every evening.      No current facility-administered medications on file prior to visit.     Allergies  Allergen Reactions  . Codeine Other (See Comments)    Heart races    Family History  Problem  Relation Age of Onset  . Diabetes Brother   . Non-Hodgkin's lymphoma Father   . Breast cancer Paternal Aunt   . Breast cancer Paternal Aunt   . Prostate cancer Neg Hx   . Pancreatic cancer Neg Hx   . Colon cancer Neg Hx     BP 128/70   Pulse 84   Ht 6' (1.829 m)   Wt (!) 316 lb (143.3 kg)   SpO2 98%   BMI 42.86 kg/m    Review of Systems He denies hypoglycemia and nausea.      Objective:   Physical Exam VITAL SIGNS:  See vs page GENERAL: no distress Pulses: dorsalis pedis intact bilat.   MSK: no deformity of the feet CV: trace bilat leg edema Skin:  no ulcer on the feet.  normal color and temp on the feet.   Neuro: sensation is intact to touch on the feet, but decreased from normal.   Ext: there is bilateral onychomycosis of the toenails.    Lab Results  Component Value Date   HGBA1C 5.9 (A) 10/09/2019       Assessment & Plan:  PN, worse.  Type 2 DM: well-controlled.  Edema: This limits rx options.   Patient Instructions  Please continue the same diabetes medications. Some specialists say that taking a high amount of folic acid (4-5 mg per day) helps the neuropathy.   check your blood sugar once a day.  vary the time of day when you check, between before the 3 meals, and at bedtime.  also check if you have symptoms of your blood sugar being too high or too low.  please keep a record of the readings and bring it to your next appointment here (or you can bring the meter itself).  You can write it on any piece of paper.  please call us sooner if your blood sugar goes below 70, or if you have a lot of readings over 200.  Please come back for a follow-up appointment in 4-5 months.

## 2019-10-09 NOTE — Patient Instructions (Addendum)
Please continue the same diabetes medications. Some specialists say that taking a high amount of folic acid (4-5 mg per day) helps the neuropathy.   check your blood sugar once a day.  vary the time of day when you check, between before the 3 meals, and at bedtime.  also check if you have symptoms of your blood sugar being too high or too low.  please keep a record of the readings and bring it to your next appointment here (or you can bring the meter itself).  You can write it on any piece of paper.  please call us sooner if your blood sugar goes below 70, or if you have a lot of readings over 200.  Please come back for a follow-up appointment in 4-5 months.

## 2019-11-04 DIAGNOSIS — Z79899 Other long term (current) drug therapy: Secondary | ICD-10-CM | POA: Diagnosis not present

## 2019-11-04 DIAGNOSIS — Z23 Encounter for immunization: Secondary | ICD-10-CM | POA: Diagnosis not present

## 2019-11-04 DIAGNOSIS — E1165 Type 2 diabetes mellitus with hyperglycemia: Secondary | ICD-10-CM | POA: Diagnosis not present

## 2019-11-04 DIAGNOSIS — Z1159 Encounter for screening for other viral diseases: Secondary | ICD-10-CM | POA: Diagnosis not present

## 2019-12-18 IMAGING — MR MR HEAD WO/W CM
12 of 13 series · 43 of 48 positions shown · IV contrast (multihance)
Comparison: None.

CLINICAL DATA: 61 y/o M; progressive hearing loss of the left ear
for years with occasional slight dizziness.

Creatinine was obtained on site at [HOSPITAL] at [HOSPITAL].
Results: Creatinine 0.7 mg/dL.
EXAM:
MRI HEAD WITHOUT AND WITH CONTRAST
TECHNIQUE: Multiplanar, multiecho pulse sequences of the brain and surrounding
structures were obtained without and with intravenous contrast. IAC
protocol.
CONTRAST:  20mL MULTIHANCE GADOBENATE DIMEGLUMINE 529 MG/ML IV SOLN

[Series 5: T1 · sagittal · 4.0mm · 0.75mm/px · 1 of 32 slices shown (1 of 3)]
[im 1/32]
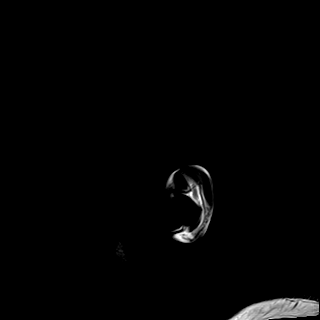

[Series 6: DWI · axial · 3.0mm · 1.50mm/px · z∈[-34,+112]mm · 6 of 82 slices shown (1 of 2)]
[im 1/82]
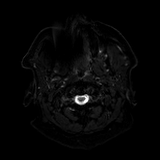
[im 17/82]
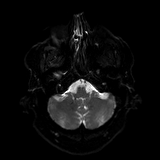
[im 33/82]
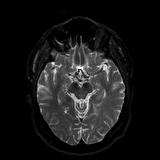
[im 49/82]
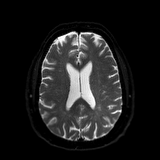
[im 65/82]
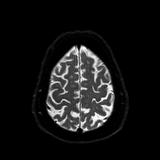
[im 82/82]
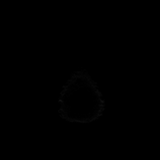

[Series 7: DWI · axial · 3.0mm · 1.50mm/px · z∈[-34,+112]mm · 3 of 41 slices shown (2 of 2)]
[im 1/41]
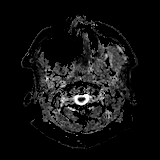
[im 21/41]
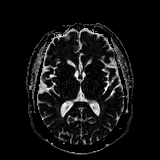
[im 41/41]
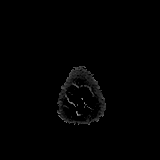

[Series 8: T2 · axial · 4.0mm · 0.38mm/px · z∈[-35,+111]mm · 2 of 31 slices shown]
[im 1/31]
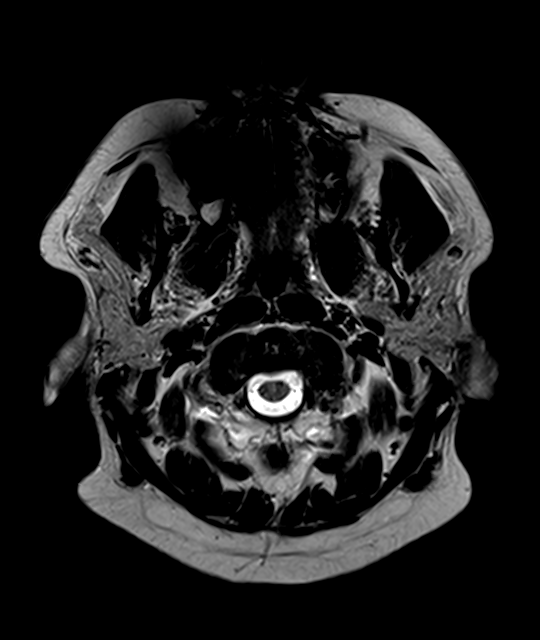
[im 31/31]
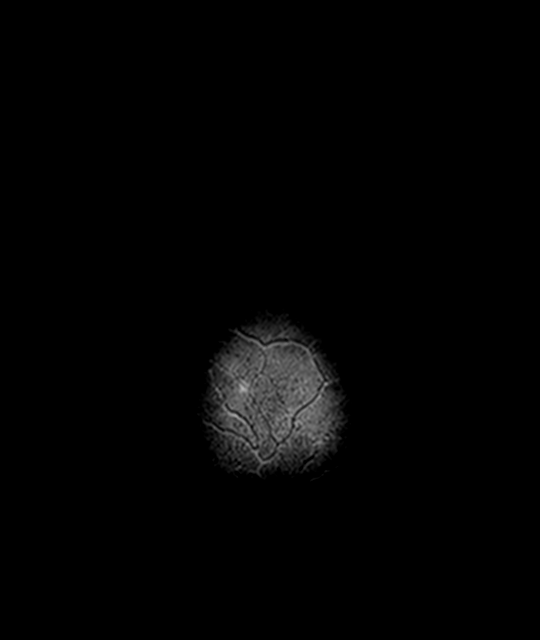

[Series 9: FLAIR · axial · 3.0mm · 0.75mm/px · z∈[-38,+114]mm · 2 of 28 slices shown]
[im 1/28]
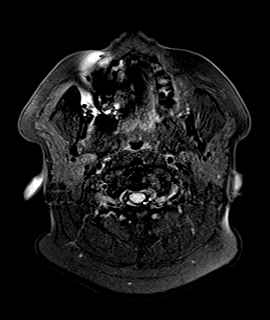
[im 28/28]
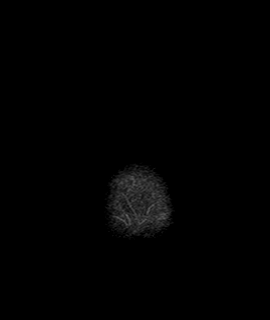

[Series 10: T1 · coronal · 2.5mm · 0.56mm/px · 1 of 16 slices shown (2 of 3)]
[im 1/16]
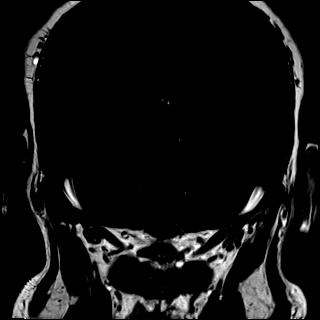

[Series 13: T1 · axial · 2.5mm · 0.56mm/px · 1 of 16 slices shown (3 of 3)]
[im 1/16]
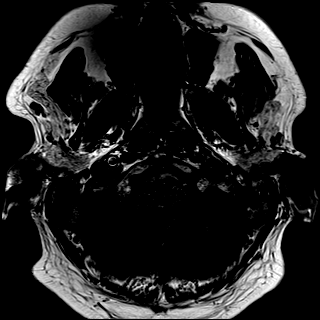

[Series 14: mip_images(sw) · axial · 12.0mm · 0.94mm/px · z∈[-29,+105]mm · 7 of 97 slices shown]
[im 1/97]
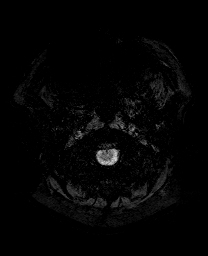
[im 17/97]
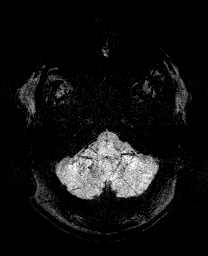
[im 33/97]
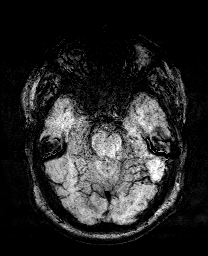
[im 49/97]
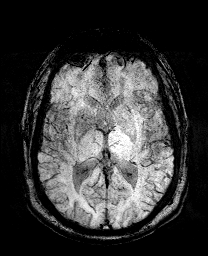
[im 65/97]
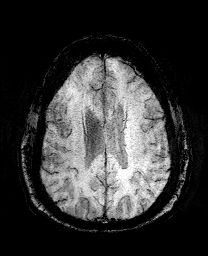
[im 81/97]
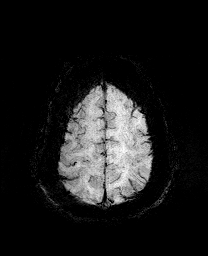
[im 97/97]
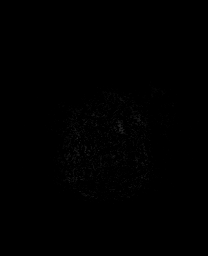

[Series 15: swi_images · axial · 1.5mm · 0.94mm/px · z∈[-34,+110]mm · 7 of 104 slices shown]
[im 1/104]
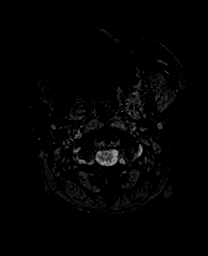
[im 18/104]
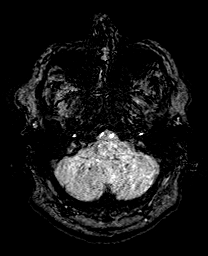
[im 35/104]
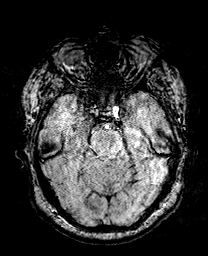
[im 52/104]
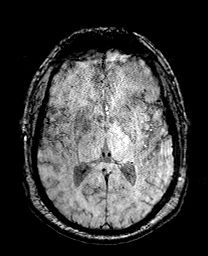
[im 69/104]
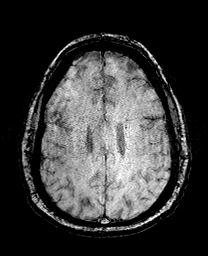
[im 86/104]
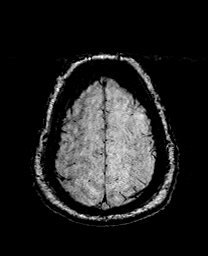
[im 104/104]
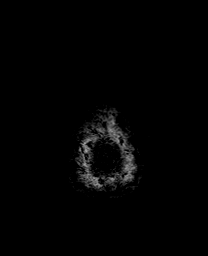

[Series 17: T1 post-contrast · coronal · 2.5mm · 0.56mm/px · 1 of 16 slices shown (1 of 3)]
[im 1/16]
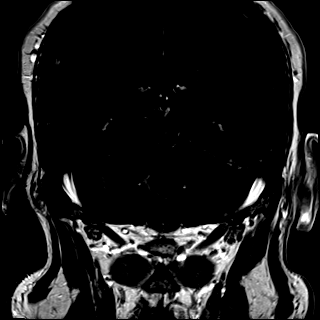

[Series 18: T1 post-contrast · axial · 2.5mm · 0.56mm/px · 1 of 16 slices shown (2 of 3)]
[im 1/16]
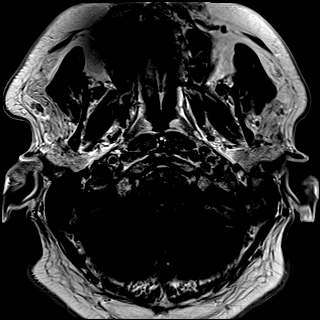

[Series 19: T1 post-contrast · axial · 1.0mm · 0.94mm/px · z∈[-37,+112]mm · 11 of 160 slices shown (3 of 3)]
[im 1/160]
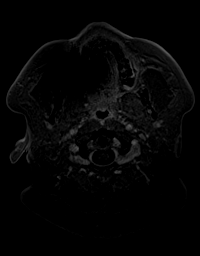
[im 16/160]
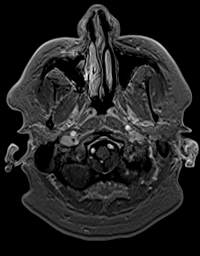
[im 32/160]
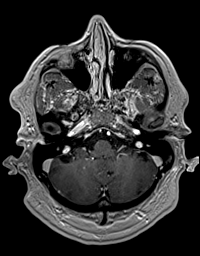
[im 48/160]
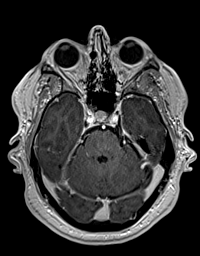
[im 64/160]
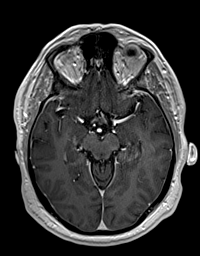
[im 80/160]
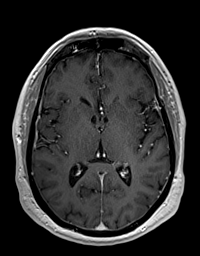
[im 96/160]
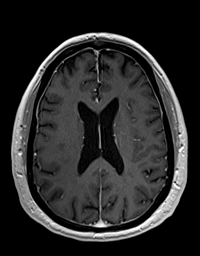
[im 112/160]
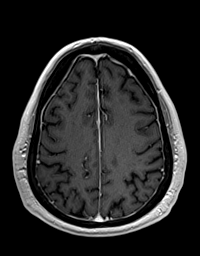
[im 128/160]
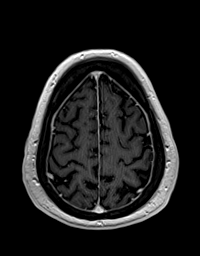
[im 144/160]
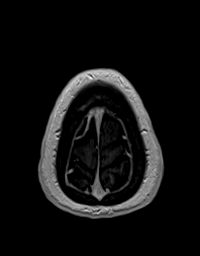
[im 160/160]
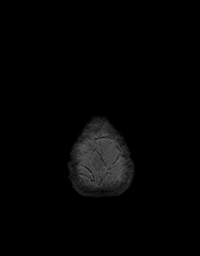

[43 of 48 positions shown; findings below may reference images not displayed]

FINDINGS: Internal auditory canal: No mass or abnormal enhancement. Cranial
nerve 7 and 8 complexes are intact. Inner ear structures are
morphologically normal and demonstrate normal signal. No high-riding
jugular bulb. No significant neurovascular compression.

Brain: No acute infarction, hemorrhage, hydrocephalus, extra-axial
collection or mass lesion. Scatter nonspecific foci of T2 FLAIR
hyperintense signal abnormality in subcortical and periventricular
white matter are compatible with mild chronic microvascular ischemic
changes for age. Mild brain parenchymal volume loss. No abnormal
enhancement.

Vascular: Normal flow voids. Small right parietal region
developmental venous anomaly.

Skull and upper cervical spine: Normal marrow signal.

Sinuses/Orbits: Small left maxillary sinus mucous retention cyst.
Otherwise negative.

Other: None.
IMPRESSION: 1. No mass or abnormal enhancement of the cranial nerve 7 and 8
complexes. Inner ear structures are morphologically normal and
demonstrate normal signal.
2. Mild chronic microvascular ischemic changes and mild parenchymal
volume loss of the brain.

By: Thin Thin Ouy M.D.

## 2020-01-25 ENCOUNTER — Ambulatory Visit: Payer: BC Managed Care – PPO | Attending: Internal Medicine

## 2020-01-25 DIAGNOSIS — Z23 Encounter for immunization: Secondary | ICD-10-CM

## 2020-01-25 NOTE — Progress Notes (Signed)
   [id number] [facility]  Name:  Tony Griffin.    MRN: [id number] DOB: [birth year]  01/25/2020  Tony Griffin was observed post Covid-19 immunization for 15 minutes without incident. He was provided with Vaccine Information Sheet and instruction to access the V-Safe system.   Tony Griffin was instructed to call 911 with any severe reactions post vaccine: Marland Kitchen Difficulty breathing  . Swelling of face and throat  . A fast heartbeat  . A bad rash all over body  . Dizziness and weakness   Immunizations Administered    Name Date Dose VIS Date Route   Pfizer COVID-19 Vaccine 01/25/2020 10:45 AM 0.3 mL 10/20/2019 Intramuscular   Manufacturer: [place]   Lot: [id number]   [place]: [id number]

## 2020-02-26 ENCOUNTER — Ambulatory Visit: Payer: BC Managed Care – PPO | Attending: Internal Medicine

## 2020-02-26 DIAGNOSIS — Z23 Encounter for immunization: Secondary | ICD-10-CM

## 2020-02-26 NOTE — Progress Notes (Signed)
   [id number] Vaccination [facility]  Name:  Rico Weckesser.    MRN: [id number] DOB: 14-Jan-1956  02/26/2020  Mr. Bacho was observed post Covid-19 immunization for 15 minutes without incident. He was provided with Vaccine Information Sheet and instruction to access the V-Safe system.   Mr. Barrett was instructed to call 911 with any severe reactions post vaccine: Marland Kitchen Difficulty breathing  . Swelling of face and throat  . A fast heartbeat  . A bad rash all over body  . Dizziness and weakness   Immunizations Administered    Name Date Dose VIS Date Route   Pfizer COVID-19 Vaccine 02/26/2020  8:21 AM 0.3 mL 01/03/2019 Intramuscular   Manufacturer: [place]   Lot: [id number]   [place]: [id number]

## 2020-03-15 DIAGNOSIS — C61 Malignant neoplasm of prostate: Secondary | ICD-10-CM | POA: Diagnosis not present

## 2020-04-10 ENCOUNTER — Other Ambulatory Visit: Payer: Self-pay

## 2020-04-12 ENCOUNTER — Ambulatory Visit: Payer: BC Managed Care – PPO | Admitting: Endocrinology

## 2020-04-12 ENCOUNTER — Other Ambulatory Visit: Payer: Self-pay

## 2020-04-12 ENCOUNTER — Encounter: Payer: Self-pay | Admitting: Endocrinology

## 2020-04-12 VITALS — BP 126/82 | HR 79 | Ht 72.0 in | Wt 320.8 lb

## 2020-04-12 DIAGNOSIS — E119 Type 2 diabetes mellitus without complications: Secondary | ICD-10-CM

## 2020-04-12 LAB — POCT GLYCOSYLATED HEMOGLOBIN (HGB A1C): Hemoglobin A1C: 6.3 % — AB (ref 4.0–5.6)

## 2020-04-12 MED ORDER — DAPAGLIFLOZIN PROPANEDIOL 10 MG PO TABS: 10.0000 mg | ORAL_TABLET | Freq: Every day | ORAL | 3 refills | Status: DC

## 2020-04-12 NOTE — Patient Instructions (Addendum)
When you come back next time, please bring [place] a copy of your annual blood test results. I have sent a prescription to your pharmacy, to double the [place] Please continue the same other medications. Please come back for a follow-up appointment in 6 months.

## 2020-04-12 NOTE — Progress Notes (Signed)
Subjective:    Patient ID: Tony Bogus., male    DOB: Mar 15, 1956, 64 y.o.   MRN: [id number]  HPI Pt returns for f/u of diabetes mellitus:   DM type: 2 Dx'ed: 6294 Complications: PN Therapy: Ozempic and 2 oral meds.   DKA: never Severe hypoglycemia: never Pancreatitis: never Pancreatic imaging: never Other: he has never been on insulin; he declines weight loss surgery.   Interval history: pt states he feels well in general.  He says cbg's are well-controlled.  He says he gets annual wellness labs at work, and they have been good.   Past Medical History:  Diagnosis Date  . At risk for sleep apnea    STOP--BANG SCORE= 6  (sent to pt's pcp 10-12-2018)  . Dyslipidemia   . HTN (hypertension)   . Hyperplasia of prostate with lower urinary tract symptoms (LUTS)   . Prostate cancer [facility]) urologist-- dr bell/  oncologist-- dr Tammi Klippel   dx 06-23-2018--- Stage T1c,  Gleason 4+3,  PSA 8.8--- plan IMRT   . Type 2 diabetes mellitus [facility])    endocrinologist-- dr Loanne Drilling  . Wears glasses     Past Surgical History:  Procedure Laterality Date  . GOLD SEED IMPLANT N/A 10/19/2018   Procedure: GOLD SEED IMPLANT;  Surgeon: Ceasar Mons, MD;  Location: [facility];  Service: Urology;  Laterality: N/A;  . NO PAST SURGERIES    . PROSTATE BIOPSY  06-23-2018   dr bell office  . SPACE OAR INSTILLATION N/A 10/19/2018   Procedure: SPACE OAR INSTILLATION;  Surgeon: Ceasar Mons, MD;  Location: [facility];  Service: Urology;  Laterality: N/A;    Social History   Socioeconomic History  . Marital status: Married    Spouse name: [name]  . Number of children: 1  . Years of education: Not on file  . Highest education level: Not on file  Occupational History  . Not on file  Tobacco Use  . Smoking status: Never Smoker  . Smokeless tobacco: Never Used  Substance and Sexual Activity  . Alcohol use: Yes    Comment: occasional  . Drug use: Never   . Sexual activity: Yes    Partners: Female    Comment: vasectomy-- 2000  Other Topics Concern  . Not on file  Social History Narrative  . Not on file   Social Determinants of Health   Financial Resource Strain:   . Difficulty of Paying Living Expenses:   Food Insecurity:   . Worried About Charity fundraiser in the Last Year:   . Arboriculturist in the Last Year:   Transportation Needs:   . Film/video editor (Medical):   Marland Kitchen Lack of Transportation (Non-Medical):   Physical Activity:   . Days of Exercise per Week:   . Minutes of Exercise per Session:   Stress:   . Feeling of Stress :   Social Connections:   . Frequency of Communication with Friends and Family:   . Frequency of Social Gatherings with Friends and Family:   . Attends Religious Services:   . Active Member of Clubs or Organizations:   . Attends Archivist Meetings:   Marland Kitchen Marital Status:   Intimate Partner Violence:   . Fear of Current or Ex-Partner:   . Emotionally Abused:   Marland Kitchen Physically Abused:   . Sexually Abused:     Current Outpatient Medications on File Prior to Visit  Medication Sig Dispense Refill  .  Cholecalciferol (VITAMIN D-3 PO) Take 1 capsule by mouth daily.    Marland Kitchen glucose blood (CONTOUR TEST) test strip 1 each by Other route daily. And lancets 1/day 100 each 3  . lisinopril (PRINIVIL,ZESTRIL) 10 MG tablet Take 10 mg by mouth every evening.     Marland Kitchen MAGNESIUM PO Take 1 tablet by mouth daily.    . metFORMIN (GLUCOPHAGE) 500 MG tablet Take 500 mg by mouth 2 (two) times daily.   3  . omeprazole (PRILOSEC) 40 MG capsule Take 40 mg by mouth every evening.     Marland Kitchen OZEMPIC, 1 MG/DOSE, 2 MG/1.5ML SOPN INJECT 1 MG INTO THE SKIN ONCE A WEEK. 1 pen 11  . rosuvastatin (CRESTOR) 10 MG tablet Take 10 mg by mouth every evening.      No current facility-administered medications on file prior to visit.    Allergies  Allergen Reactions  . Codeine Other (See Comments)    Heart races    Family History   Problem Relation Age of Onset  . Diabetes Brother   . Non-Hodgkin's lymphoma Father   . Breast cancer Paternal Aunt   . Breast cancer Paternal Aunt   . Prostate cancer Neg Hx   . Pancreatic cancer Neg Hx   . Colon cancer Neg Hx     BP 126/82 (BP Location: Left Arm, Patient Position: Sitting, Cuff Size: Large)   Pulse 79   Ht 6' (1.829 m)   Wt (!) 320 lb 12.8 oz (145.5 kg)   SpO2 97%   BMI 43.51 kg/m    Review of Systems He has gained weight.      Objective:   Physical Exam VITAL SIGNS:  See vs page GENERAL: no distress Pulses: dorsalis pedis intact bilat.   MSK: no deformity of the feet CV: trace bilat leg edema Skin:  no ulcer on the feet.  normal color and temp on the feet.   Neuro: sensation is intact to touch on the feet.    Lab Results  Component Value Date   NA 136 10/19/2018   K 3.8 10/19/2018     Lab Results  Component Value Date   HGBA1C 6.3 (A) 04/12/2020       Assessment & Plan:  Type 2 DM: well-controlled  Patient Instructions  When you come back next time, please bring [place] a copy of your annual blood test results. I have sent a prescription to your pharmacy, to double the [place] Please continue the same other medications. Please come back for a follow-up appointment in 6 months.

## 2020-04-15 ENCOUNTER — Other Ambulatory Visit: Payer: Self-pay

## 2020-04-15 MED ORDER — DAPAGLIFLOZIN PROPANEDIOL 10 MG PO TABS: 10.0000 mg | ORAL_TABLET | Freq: Every day | ORAL | 3 refills | Status: DC

## 2020-04-23 ENCOUNTER — Other Ambulatory Visit: Payer: Self-pay

## 2020-04-23 MED ORDER — DAPAGLIFLOZIN PROPANEDIOL 10 MG PO TABS: 10.0000 mg | ORAL_TABLET | Freq: Every day | ORAL | 3 refills | Status: DC

## 2020-04-23 MED ORDER — OZEMPIC (1 MG/DOSE) 2 MG/1.5ML ~~LOC~~ SOPN: 1.0000 mg | PEN_INJECTOR | SUBCUTANEOUS | 11 refills | Status: DC

## 2020-05-06 DIAGNOSIS — E1165 Type 2 diabetes mellitus with hyperglycemia: Secondary | ICD-10-CM | POA: Diagnosis not present

## 2020-05-06 DIAGNOSIS — Z125 Encounter for screening for malignant neoplasm of prostate: Secondary | ICD-10-CM | POA: Diagnosis not present

## 2020-05-06 DIAGNOSIS — Z1152 Encounter for screening for COVID-19: Secondary | ICD-10-CM | POA: Diagnosis not present

## 2020-05-06 DIAGNOSIS — I10 Essential (primary) hypertension: Secondary | ICD-10-CM | POA: Diagnosis not present

## 2020-05-06 DIAGNOSIS — E559 Vitamin D deficiency, unspecified: Secondary | ICD-10-CM | POA: Diagnosis not present

## 2020-05-06 DIAGNOSIS — Z0000 Encounter for general adult medical examination without abnormal findings: Secondary | ICD-10-CM | POA: Diagnosis not present

## 2020-05-06 DIAGNOSIS — R5383 Other fatigue: Secondary | ICD-10-CM | POA: Diagnosis not present

## 2020-05-07 ENCOUNTER — Other Ambulatory Visit: Payer: Self-pay

## 2020-05-07 NOTE — Progress Notes (Signed)
Error

## 2020-05-08 ENCOUNTER — Other Ambulatory Visit: Payer: Self-pay

## 2020-05-08 MED ORDER — OZEMPIC (1 MG/DOSE) 4 MG/3ML ~~LOC~~ SOPN: 1.0000 mg | PEN_INJECTOR | SUBCUTANEOUS | 11 refills | Status: DC

## 2020-09-02 DIAGNOSIS — N3000 Acute cystitis without hematuria: Secondary | ICD-10-CM | POA: Diagnosis not present

## 2020-09-02 DIAGNOSIS — B9620 Unspecified Escherichia coli [E. coli] as the cause of diseases classified elsewhere: Secondary | ICD-10-CM | POA: Diagnosis not present

## 2020-09-02 DIAGNOSIS — N390 Urinary tract infection, site not specified: Secondary | ICD-10-CM | POA: Diagnosis not present

## 2020-09-16 DIAGNOSIS — N401 Enlarged prostate with lower urinary tract symptoms: Secondary | ICD-10-CM | POA: Diagnosis not present

## 2020-09-16 DIAGNOSIS — C61 Malignant neoplasm of prostate: Secondary | ICD-10-CM | POA: Diagnosis not present

## 2020-09-16 DIAGNOSIS — N3000 Acute cystitis without hematuria: Secondary | ICD-10-CM | POA: Diagnosis not present

## 2020-09-16 DIAGNOSIS — R351 Nocturia: Secondary | ICD-10-CM | POA: Diagnosis not present

## 2020-09-16 IMAGING — NM NM BONE WHOLE BODY
2 series · 2 of 2 positions shown · non-contrast
Comparison: None.

CLINICAL DATA: Prostate cancer.  Staging.

EXAM:
NUCLEAR MEDICINE WHOLE BODY BONE SCAN
TECHNIQUE: Whole body anterior and posterior images were obtained approximately
3 hours after intravenous injection of radiopharmaceutical.
RADIOPHARMACEUTICALS:  19.7 mCi Xechnetium-GGm MDP IV

[Series 1: whole body · 2.66mm/px · 1 of 1 slices shown (1 of 2)]
[im 1/1]
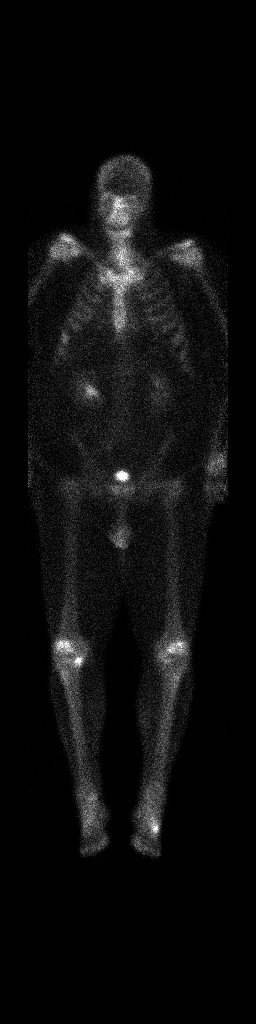

[Series 1: whole body · 2.66mm/px · 1 of 1 slices shown (2 of 2)]
[im 1/1]
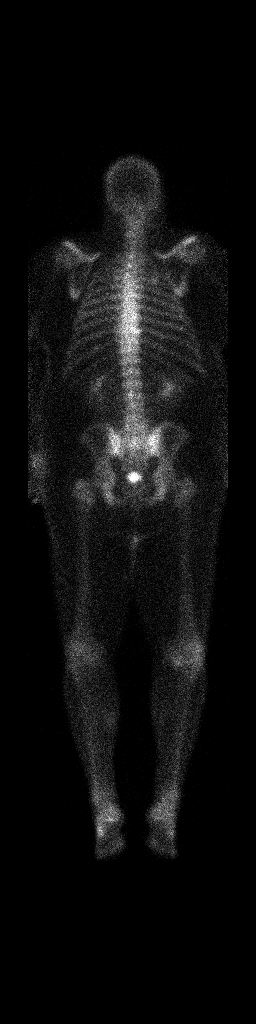

[2 of 2 positions shown; findings below may reference images not displayed]

FINDINGS: There is a focus of uptake at a lower right Zeinab chondral junction.
No definitive CT correlate on the recent CT the abdomen and pelvis.
Minimal uptake in left mandible consistent with thought I dont Anil
disease. Degenerative changes in the knees and left foot.
Degenerative changes in the thoracic spine.
IMPRESSION: 1. There is mild uptake in a right lower costochondral junction,
likely from previous trauma or another benign cause. Metastatic
disease considered unlikely. Recommend attention on follow-up.
2. Other scattered degenerative changes. No other evidence of
metastatic disease.

## 2020-09-27 DIAGNOSIS — N3000 Acute cystitis without hematuria: Secondary | ICD-10-CM | POA: Diagnosis not present

## 2020-09-27 DIAGNOSIS — C61 Malignant neoplasm of prostate: Secondary | ICD-10-CM | POA: Diagnosis not present

## 2020-10-18 ENCOUNTER — Ambulatory Visit (INDEPENDENT_AMBULATORY_CARE_PROVIDER_SITE_OTHER): Payer: BC Managed Care – PPO | Admitting: Endocrinology

## 2020-10-18 ENCOUNTER — Encounter: Payer: Self-pay | Admitting: Endocrinology

## 2020-10-18 ENCOUNTER — Other Ambulatory Visit: Payer: Self-pay

## 2020-10-18 VITALS — BP 138/80 | HR 89 | Ht 72.0 in | Wt 324.4 lb

## 2020-10-18 DIAGNOSIS — E119 Type 2 diabetes mellitus without complications: Secondary | ICD-10-CM | POA: Diagnosis not present

## 2020-10-18 LAB — POCT GLYCOSYLATED HEMOGLOBIN (HGB A1C): Hemoglobin A1C: 6.3 % — AB (ref 4.0–5.6)

## 2020-10-18 MED ORDER — DAPAGLIFLOZIN PROPANEDIOL 5 MG PO TABS: 5.0000 mg | ORAL_TABLET | Freq: Every day | ORAL | 3 refills | Status: DC

## 2020-10-18 NOTE — Progress Notes (Signed)
Subjective:    Patient ID: Tony Bogus., male    DOB: 1956/01/22, 64 y.o.   MRN: [id number]  HPI Pt returns for f/u of diabetes mellitus:   DM type: 2 Dx'ed: 5465 Complications: PN Therapy: Ozempic and 2 oral meds.   DKA: never Severe hypoglycemia: never Pancreatitis: never Pancreatic imaging: never Other: he has never been on insulin; he declines weight loss surgery.   Interval history: pt states he feels well in general.  He says cbg's are well-controlled.  He had a UTI rx'ed recently.  Past Medical History:  Diagnosis Date  . At risk for sleep apnea    STOP--BANG SCORE= 6  (sent to pt's pcp 10-12-2018)  . Dyslipidemia   . HTN (hypertension)   . Hyperplasia of prostate with lower urinary tract symptoms (LUTS)   . Prostate cancer [facility]) urologist-- dr bell/  oncologist-- dr Tammi Klippel   dx 06-23-2018--- Stage T1c,  Gleason 4+3,  PSA 8.8--- plan IMRT   . Type 2 diabetes mellitus [facility])    endocrinologist-- dr Loanne Drilling  . Wears glasses     Past Surgical History:  Procedure Laterality Date  . GOLD SEED IMPLANT N/A 10/19/2018   Procedure: GOLD SEED IMPLANT;  Surgeon: Ceasar Mons, MD;  Location: [facility];  Service: Urology;  Laterality: N/A;  . NO PAST SURGERIES    . PROSTATE BIOPSY  06-23-2018   dr bell office  . SPACE OAR INSTILLATION N/A 10/19/2018   Procedure: SPACE OAR INSTILLATION;  Surgeon: Ceasar Mons, MD;  Location: [facility];  Service: Urology;  Laterality: N/A;    Social History   Socioeconomic History  . Marital status: Married    Spouse name: [name]  . Number of children: 1  . Years of education: Not on file  . Highest education level: Not on file  Occupational History  . Not on file  Tobacco Use  . Smoking status: Never Smoker  . Smokeless tobacco: Never Used  Vaping Use  . Vaping Use: Never used  Substance and Sexual Activity  . Alcohol use: Yes    Comment: occasional  . Drug use: Never  .  Sexual activity: Yes    Partners: Female    Comment: vasectomy-- 2000  Other Topics Concern  . Not on file  Social History Narrative  . Not on file   Social Determinants of Health   Financial Resource Strain: Not on file  Food Insecurity: Not on file  Transportation Needs: Not on file  Physical Activity: Not on file  Stress: Not on file  Social Connections: Not on file  Intimate Partner Violence: Not on file    Current Outpatient Medications on File Prior to Visit  Medication Sig Dispense Refill  . Cholecalciferol (VITAMIN D-3 PO) Take 1 capsule by mouth daily.    Marland Kitchen glucose blood (CONTOUR TEST) test strip 1 each by Other route daily. And lancets 1/day 100 each 3  . lisinopril (PRINIVIL,ZESTRIL) 10 MG tablet Take 10 mg by mouth every evening.     Marland Kitchen MAGNESIUM PO Take 1 tablet by mouth daily.    . metFORMIN (GLUCOPHAGE) 500 MG tablet Take 500 mg by mouth 2 (two) times daily.   3  . omeprazole (PRILOSEC) 40 MG capsule Take 40 mg by mouth every evening.     . rosuvastatin (CRESTOR) 10 MG tablet Take 10 mg by mouth every evening.     . Semaglutide, 1 MG/DOSE, (OZEMPIC, 1 MG/DOSE,) 4 MG/3ML SOPN Inject 0.75  mLs (1 mg total) into the skin once a week. Inject 1 MG once a week 3 mL 11  . tamsulosin (FLOMAX) 0.4 MG CAPS capsule Take 0.4 mg by mouth daily.     No current facility-administered medications on file prior to visit.    Allergies  Allergen Reactions  . Codeine Other (See Comments)    Heart races    Family History  Problem Relation Age of Onset  . Diabetes Brother   . Non-Hodgkin's lymphoma Father   . Breast cancer Paternal Aunt   . Breast cancer Paternal Aunt   . Prostate cancer Neg Hx   . Pancreatic cancer Neg Hx   . Colon cancer Neg Hx     BP 138/80 (BP Location: Left Arm, Patient Position: Sitting, Cuff Size: Large)   Pulse 89   Ht 6' (1.829 m)   Wt (!) 324 lb 6.4 oz (147.1 kg)   SpO2 98%   BMI 44.00 kg/m    Review of Systems     Objective:   Physical  Exam VITAL SIGNS:  See vs page GENERAL: no distress Pulses: dorsalis pedis intact bilat.   MSK: no deformity of the feet CV: trace bilat leg edema Skin:  no ulcer on the feet, but the skin is dry.  normal color and temp on the feet. Neuro: sensation is intact to touch on the feet, but decreased from normal Ext: there is bilateral onychomycosis of the toenails  A1c=6.3%    Assessment & Plan:  Type 2 DM: well-controlled UTI, due to [place]  Patient Instructions  When you come back next time, please bring [place] a copy of your annual blood test results.  I have sent a prescription to your pharmacy, to reduce the [place].   Please continue the same other medications.  Please come back for a follow-up appointment in 6 months.

## 2020-10-18 NOTE — Patient Instructions (Addendum)
When you come back next time, please bring [place] a copy of your annual blood test results.  I have sent a prescription to your pharmacy, to reduce the [place].   Please continue the same other medications.  Please come back for a follow-up appointment in 6 months.

## 2021-03-14 DIAGNOSIS — C61 Malignant neoplasm of prostate: Secondary | ICD-10-CM | POA: Diagnosis not present

## 2021-03-26 DIAGNOSIS — U071 COVID-19: Secondary | ICD-10-CM | POA: Diagnosis not present

## 2021-04-11 ENCOUNTER — Ambulatory Visit: Payer: BC Managed Care – PPO | Admitting: Endocrinology

## 2021-04-11 ENCOUNTER — Other Ambulatory Visit: Payer: Self-pay

## 2021-04-11 VITALS — BP 126/84 | HR 96 | Ht 72.0 in | Wt 325.4 lb

## 2021-04-11 DIAGNOSIS — E119 Type 2 diabetes mellitus without complications: Secondary | ICD-10-CM

## 2021-04-11 LAB — POCT GLYCOSYLATED HEMOGLOBIN (HGB A1C): Hemoglobin A1C: 6.6 % — AB (ref 4.0–5.6)

## 2021-04-11 MED ORDER — DAPAGLIFLOZIN PROPANEDIOL 5 MG PO TABS: 5.0000 mg | ORAL_TABLET | Freq: Every day | ORAL | 3 refills | Status: DC

## 2021-04-11 MED ORDER — OZEMPIC (2 MG/DOSE) 8 MG/3ML ~~LOC~~ SOPN
2.0000 mg | PEN_INJECTOR | SUBCUTANEOUS | 3 refills | Status: DC
Start: 2021-04-11 — End: 2021-07-09

## 2021-04-11 NOTE — Progress Notes (Signed)
Subjective:    Patient ID: Tony Bogus., male    DOB: [birth year], 65 y.o.   MRN: [id number]  HPI Pt returns for f/u of diabetes mellitus:   DM type: 2 Dx'ed: 9323 Complications: PN Therapy: Ozempic and 2 oral meds.   DKA: never Severe hypoglycemia: never Pancreatitis: never Pancreatic imaging: never Other: he has never been on insulin; he declines weight loss surgery; Wilder Glade dosage is limited by UTI's  Interval history: pt states he feels well in general.  He says cbg's are well-controlled.   Past Medical History:  Diagnosis Date  . At risk for sleep apnea    STOP--BANG SCORE= 6  (sent to pt's pcp 10-12-2018)  . Dyslipidemia   . HTN (hypertension)   . Hyperplasia of prostate with lower urinary tract symptoms (LUTS)   . Prostate cancer [facility]) urologist-- dr bell/  oncologist-- dr Tammi Klippel   dx 06-23-2018--- Stage T1c,  Gleason 4+3,  PSA 8.8--- plan IMRT   . Type 2 diabetes mellitus [facility])    endocrinologist-- dr Loanne Drilling  . Wears glasses     Past Surgical History:  Procedure Laterality Date  . GOLD SEED IMPLANT N/A 10/19/2018   Procedure: GOLD SEED IMPLANT;  Surgeon: Ceasar Mons, MD;  Location: [facility];  Service: Urology;  Laterality: N/A;  . NO PAST SURGERIES    . PROSTATE BIOPSY  06-23-2018   dr bell office  . SPACE OAR INSTILLATION N/A 10/19/2018   Procedure: SPACE OAR INSTILLATION;  Surgeon: Ceasar Mons, MD;  [facility];  Service: Urology;  Laterality: N/A;    Social History   Socioeconomic History  . Marital status: Married    Spouse name: [name]  . Number of children: 1  . Years of education: Not on file  . Highest education level: Not on file  Occupational History  . Not on file  Tobacco Use  . Smoking status: Never Smoker  . Smokeless tobacco: Never Used  Vaping Use  . Vaping Use: Never used  Substance and Sexual Activity  . Alcohol use: Yes    Comment: occasional  . Drug use:  Never  . Sexual activity: Yes    Partners: Female    Comment: vasectomy-- 2000  Other Topics Concern  . Not on file  Social History Narrative  . Not on file   Social Determinants of Health   Financial Resource Strain: Not on file  Food Insecurity: Not on file  Transportation Needs: Not on file  Physical Activity: Not on file  Stress: Not on file  Social Connections: Not on file  Intimate Partner Violence: Not on file    Current Outpatient Medications on File Prior to Visit  Medication Sig Dispense Refill  . Cholecalciferol (VITAMIN D-3 PO) Take 1 capsule by mouth daily.    Marland Kitchen glucose blood (CONTOUR TEST) test strip 1 each by Other route daily. And lancets 1/day 100 each 3  . lisinopril (PRINIVIL,ZESTRIL) 10 MG tablet Take 10 mg by mouth every evening.     Marland Kitchen MAGNESIUM PO Take 1 tablet by mouth daily.    . metFORMIN (GLUCOPHAGE) 500 MG tablet Take 500 mg by mouth 2 (two) times daily.   3  . omeprazole (PRILOSEC) 40 MG capsule Take 40 mg by mouth every evening.     . rosuvastatin (CRESTOR) 10 MG tablet Take 10 mg by mouth every evening.     . tamsulosin (FLOMAX) 0.4 MG CAPS capsule Take 0.4 mg by mouth daily.  No current facility-administered medications on file prior to visit.    Allergies  Allergen Reactions  . Codeine Other (See Comments)    Heart races    Family History  Problem Relation Age of Onset  . Diabetes Brother   . Non-Hodgkin's lymphoma Father   . Breast cancer Paternal Aunt   . Breast cancer Paternal Aunt   . Prostate cancer Neg Hx   . Pancreatic cancer Neg Hx   . Colon cancer Neg Hx     BP 126/84 (BP Location: Right Arm, Patient Position: Sitting, Cuff Size: Large)   Pulse 96   Ht 6' (1.829 m)   Wt (!) 325 lb 6.4 oz (147.6 kg)   SpO2 96%   BMI 44.13 kg/m    Review of Systems Denies n/v/HB    Objective:   Physical Exam VITAL SIGNS:  See vs page GENERAL: no distress Pulses: dorsalis pedis intact bilat.   MSK: no deformity of the  feet CV: 1+ bilat leg edema Skin:  no ulcer on the feet.  normal color and temp on the feet. Neuro: sensation is intact to touch on the feet.    Lab Results  Component Value Date   HGBA1C 6.6 (A) 04/11/2021       Assessment & Plan:  Type 2 DM: uncontrolled, as goal A1c is low 6's  Patient Instructions  I have sent a prescription to your pharmacy, to increase the Ozempic.   Please continue the same other medications.  Please come back for a follow-up appointment in 6 months.

## 2021-04-11 NOTE — Patient Instructions (Addendum)
I have sent a prescription to your pharmacy, to increase the Ozempic.   Please continue the same other medications.  Please come back for a follow-up appointment in 6 months.

## 2021-07-09 ENCOUNTER — Other Ambulatory Visit: Payer: Self-pay | Admitting: Endocrinology

## 2021-07-09 ENCOUNTER — Telehealth: Payer: Self-pay | Admitting: Endocrinology

## 2021-07-09 MED ORDER — TRULICITY 4.5 MG/0.5ML ~~LOC~~ SOAJ
4.5000 mg | SUBCUTANEOUS | 3 refills | Status: DC
Start: 2021-07-09 — End: 2021-10-16

## 2021-07-09 NOTE — Telephone Encounter (Signed)
LVM for pt to let him know that Loanne Drilling changed his Ozempic to [employer].

## 2021-07-09 NOTE — Telephone Encounter (Signed)
Pt is having trouble finding '2mg'$  of ozempic. Patient found the '1mg'$  at the [facility] on [address] [place], [place] [address]. Pt would like to see if he can get a prescription sent in for the '1mg'$  of ozempic. Pt would like a call back once this is sent in

## 2021-07-31 DIAGNOSIS — N390 Urinary tract infection, site not specified: Secondary | ICD-10-CM | POA: Diagnosis not present

## 2021-07-31 DIAGNOSIS — B9689 Other specified bacterial agents as the cause of diseases classified elsewhere: Secondary | ICD-10-CM | POA: Diagnosis not present

## 2021-10-16 ENCOUNTER — Ambulatory Visit (INDEPENDENT_AMBULATORY_CARE_PROVIDER_SITE_OTHER): Payer: Medicare Other | Admitting: Endocrinology

## 2021-10-16 ENCOUNTER — Telehealth: Payer: Self-pay | Admitting: Endocrinology

## 2021-10-16 ENCOUNTER — Other Ambulatory Visit: Payer: Self-pay

## 2021-10-16 VITALS — BP 130/84 | HR 80 | Ht 72.0 in | Wt 334.0 lb

## 2021-10-16 DIAGNOSIS — E119 Type 2 diabetes mellitus without complications: Secondary | ICD-10-CM | POA: Diagnosis not present

## 2021-10-16 LAB — POCT GLYCOSYLATED HEMOGLOBIN (HGB A1C): Hemoglobin A1C: 7.1 % — AB (ref 4.0–5.6)

## 2021-10-16 MED ORDER — METFORMIN HCL ER 500 MG PO TB24: 2000.0000 mg | ORAL_TABLET | Freq: Every day | ORAL | 3 refills | Status: DC

## 2021-10-16 MED ORDER — TRULICITY 4.5 MG/0.5ML ~~LOC~~ SOAJ: 4.5000 mg | SUBCUTANEOUS | 3 refills | Status: DC

## 2021-10-16 NOTE — Patient Instructions (Addendum)
I have sent a prescription to your pharmacy, to increase the metformin.   Please continue the same other medications.  Please come back for a follow-up appointment in 3 months.

## 2021-10-16 NOTE — Telephone Encounter (Signed)
please contact [facility].  We need 2022 labs, thanks.

## 2021-10-16 NOTE — Progress Notes (Signed)
Subjective:    Patient ID: Tony Bogus., male    DOB: Oct 13, 1956, 65 y.o.   MRN: [id number]  HPI Pt returns for f/u of diabetes mellitus:  DM type: 2 Dx'ed: 7741 Complications: PN Therapy: Ozempic and 2 oral meds.   DKA: never Severe hypoglycemia: never Pancreatitis: never Pancreatic imaging: never Other: he has never been on insulin; he declines weight loss surgery; Wilder Glade dosage is limited by UTI's.   Interval history: pt states he feels well in general.  He says cbg's are well-controlled.   Past Medical History:  Diagnosis Date   At risk for sleep apnea    STOP--BANG SCORE= 6  (sent to pt's pcp 10-12-2018)   Dyslipidemia    HTN (hypertension)    Hyperplasia of prostate with lower urinary tract symptoms (LUTS)    Prostate cancer [facility]) urologist-- dr bell/  oncologist-- dr Tammi Klippel   dx 06-23-2018--- Stage T1c,  Gleason 4+3,  PSA 8.8--- plan IMRT    Type 2 diabetes mellitus [facility])    endocrinologist-- dr Loanne Drilling   Wears glasses     Past Surgical History:  Procedure Laterality Date   GOLD SEED IMPLANT N/A 10/19/2018   Procedure: GOLD SEED IMPLANT;  Surgeon: Ceasar Mons, MD;  Location: [facility];  Service: Urology;  Laterality: N/A;   NO PAST SURGERIES     PROSTATE BIOPSY  06-23-2018   dr bell office   SPACE OAR INSTILLATION N/A 10/19/2018   Procedure: SPACE OAR INSTILLATION;  Surgeon: Ceasar Mons, MD;  Location: [facility];  Service: Urology;  Laterality: N/A;    Social History   Socioeconomic History   Marital status: Married    Spouse name: [name]   Number of children: 1   Years of education: Not on file   Highest education level: Not on file  Occupational History   Not on file  Tobacco Use   Smoking status: Never   Smokeless tobacco: Never  Vaping Use   Vaping Use: Never used  Substance and Sexual Activity   Alcohol use: Yes    Comment: occasional   Drug use: Never   Sexual activity: Yes     Partners: Female    Comment: vasectomy-- 2000  Other Topics Concern   Not on file  Social History Narrative   Not on file   Social Determinants of Health   Financial Resource Strain: Not on file  Food Insecurity: Not on file  Transportation Needs: Not on file  Physical Activity: Not on file  Stress: Not on file  Social Connections: Not on file  Intimate Partner Violence: Not on file    Current Outpatient Medications on File Prior to Visit  Medication Sig Dispense Refill   Cholecalciferol (VITAMIN D-3 PO) Take 1 capsule by mouth daily.     dapagliflozin propanediol (FARXIGA) 5 MG TABS tablet Take 1 tablet (5 mg total) by mouth daily before breakfast. 90 tablet 3   glucose blood (CONTOUR TEST) test strip 1 each by Other route daily. And lancets 1/day 100 each 3   lisinopril (PRINIVIL,ZESTRIL) 10 MG tablet Take 10 mg by mouth every evening.      MAGNESIUM PO Take 1 tablet by mouth daily.     omeprazole (PRILOSEC) 40 MG capsule Take 40 mg by mouth every evening.      rosuvastatin (CRESTOR) 10 MG tablet Take 10 mg by mouth every evening.      tamsulosin (FLOMAX) 0.4 MG CAPS capsule Take 0.4 mg by  mouth daily.     No current facility-administered medications on file prior to visit.    Allergies  Allergen Reactions   Codeine Other (See Comments)    Heart races    Family History  Problem Relation Age of Onset   Diabetes Brother    Non-Hodgkin's lymphoma Father    Breast cancer Paternal Aunt    Breast cancer Paternal Aunt    Prostate cancer Neg Hx    Pancreatic cancer Neg Hx    Colon cancer Neg Hx     BP 130/84   Pulse 80   Ht 6' (1.829 m)   Wt (!) 334 lb (151.5 kg)   SpO2 96%   BMI 45.30 kg/m    Review of Systems     Objective:   Physical Exam VITAL SIGNS:  See vs page GENERAL: no distress Pulses: dorsalis pedis intact bilat.   MSK: no deformity of the feet CV: 1+ bilat leg edema Skin:  no ulcer on the feet.  normal color and temp on the feet.  Neuro:  sensation is intact to touch on the feet, but decreased from normal.    A1c=7.1%  Lab Results  Component Value Date   NA 136 10/19/2018   K 3.8 10/19/2018      Assessment & Plan:  Type 2 DM: uncontrolled.  We are requesting labs from [facility] medical  Patient Instructions  I have sent a prescription to your pharmacy, to increase the metformin.   Please continue the same other medications.  Please come back for a follow-up appointment in 3 months.

## 2021-10-17 ENCOUNTER — Other Ambulatory Visit: Payer: Self-pay | Admitting: Endocrinology

## 2021-10-17 ENCOUNTER — Telehealth: Payer: Self-pay | Admitting: Endocrinology

## 2021-10-17 MED ORDER — TIRZEPATIDE 10 MG/0.5ML ~~LOC~~ SOAJ: 10.0000 mg | SUBCUTANEOUS | 3 refills | Status: DC

## 2021-10-17 NOTE — Telephone Encounter (Signed)
Pt called concerning Dulaglutide (TRULICITY) 4.5 YM/4.1RA SOPN per [facility] unable to provide update on any future shipment. PT has taken Ozempic in the past at 2MG  and needs to know if Provide can prescribe Ozempic at 1.5 or 3.0 MG.  Please advise PT [phone]

## 2021-10-19 ENCOUNTER — Other Ambulatory Visit: Payer: Self-pay | Admitting: Endocrinology

## 2021-10-19 MED ORDER — VICTOZA 18 MG/3ML ~~LOC~~ SOPN: 1.8000 mg | PEN_INJECTOR | Freq: Every day | SUBCUTANEOUS | 3 refills | Status: DC

## 2021-10-20 NOTE — Telephone Encounter (Signed)
Pt has been informed of the change.

## 2022-01-15 ENCOUNTER — Ambulatory Visit (INDEPENDENT_AMBULATORY_CARE_PROVIDER_SITE_OTHER): Payer: Medicare Other | Admitting: Endocrinology

## 2022-01-15 ENCOUNTER — Other Ambulatory Visit: Payer: Self-pay

## 2022-01-15 VITALS — BP 142/82 | HR 83 | Ht 72.0 in | Wt 332.4 lb

## 2022-01-15 DIAGNOSIS — E119 Type 2 diabetes mellitus without complications: Secondary | ICD-10-CM | POA: Diagnosis not present

## 2022-01-15 LAB — POCT GLYCOSYLATED HEMOGLOBIN (HGB A1C): Hemoglobin A1C: 6.8 % — AB (ref 4.0–5.6)

## 2022-01-15 MED ORDER — BROMOCRIPTINE MESYLATE 2.5 MG PO TABS: 1.2500 mg | ORAL_TABLET | Freq: Every day | ORAL | 3 refills | Status: DC

## 2022-01-15 MED ORDER — TRULICITY 4.5 MG/0.5ML ~~LOC~~ SOAJ: 4.5000 mg | SUBCUTANEOUS | 3 refills | Status: DC

## 2022-01-15 NOTE — Progress Notes (Signed)
? ?Subjective:  ? ? Patient ID: Tony Bogus., male    DOB: Jun 08, 1956, 66 y.o.   MRN: [id number] ? ?HPI ?Pt returns for f/u of diabetes mellitus:  ?DM type: 2 ?Dx'ed: 2017 ?Complications: PN ?Therapy: Victoza and 2 oral meds.   ?DKA: never ?Severe hypoglycemia: never ?Pancreatitis: never ?Pancreatic imaging: never ?Other: he has never been on insulin; he declines weight loss surgery; Wilder Glade dosage is limited by UTI's.   ?Interval history: pt states he feels well in general.  He says cbg's are well-controlled.  He changed Victoza back to Trulicity.   ?Past Medical History:  ?Diagnosis Date  ? At risk for sleep apnea   ? STOP--BANG SCORE= 6  (sent to pt's pcp 10-12-2018)  ? Dyslipidemia   ? HTN (hypertension)   ? Hyperplasia of prostate with lower urinary tract symptoms (LUTS)   ? Prostate cancer [facility]) urologist-- dr bell/  oncologist-- dr Tammi Klippel  ? dx 06-23-2018--- Stage T1c,  Gleason 4+3,  PSA 8.8--- plan IMRT   ? Type 2 diabetes mellitus ([place])   ? endocrinologist-- dr Loanne Drilling  ? Wears glasses   ? ? ?Past Surgical History:  ?Procedure Laterality Date  ? GOLD SEED IMPLANT N/A 10/19/2018  ? Procedure: GOLD SEED IMPLANT;  Surgeon: Ceasar Mons, MD;  Location: [facility];  Service: Urology;  Laterality: N/A;  ? NO PAST SURGERIES    ? PROSTATE BIOPSY  06-23-2018   dr bell office  ? SPACE OAR INSTILLATION N/A 10/19/2018  ? Procedure: SPACE OAR INSTILLATION;  Surgeon: Ceasar Mons, MD;  Location: [facility];  Service: Urology;  Laterality: N/A;  ? ? ?Social History  ? ?Socioeconomic History  ? Marital status: Married  ?  Spouse name: [name]  ? Number of children: 1  ? Years of education: Not on file  ? Highest education level: Not on file  ?Occupational History  ? Not on file  ?Tobacco Use  ? Smoking status: Never  ? Smokeless tobacco: Never  ?Vaping Use  ? Vaping Use: Never used  ?Substance and Sexual Activity  ? Alcohol use: Yes  ?  Comment: occasional  ? Drug  use: Never  ? Sexual activity: Yes  ?  Partners: Female  ?  Comment: vasectomy-- 2000  ?Other Topics Concern  ? Not on file  ?Social History Narrative  ? Not on file  ? ?Social Determinants of Health  ? ?Financial Resource Strain: Not on file  ?Food Insecurity: Not on file  ?Transportation Needs: Not on file  ?Physical Activity: Not on file  ?Stress: Not on file  ?Social Connections: Not on file  ?Intimate Partner Violence: Not on file  ? ? ?Current Outpatient Medications on File Prior to Visit  ?Medication Sig Dispense Refill  ? Cholecalciferol (VITAMIN D-3 PO) Take 1 capsule by mouth daily.    ? dapagliflozin propanediol (FARXIGA) 5 MG TABS tablet Take 1 tablet (5 mg total) by mouth daily before breakfast. 90 tablet 3  ? glucose blood (CONTOUR TEST) test strip 1 each by Other route daily. And lancets 1/day 100 each 3  ? lisinopril (PRINIVIL,ZESTRIL) 10 MG tablet Take 10 mg by mouth every evening.     ? MAGNESIUM PO Take 1 tablet by mouth daily.    ? metFORMIN (GLUCOPHAGE-XR) 500 MG 24 hr tablet Take 4 tablets (2,000 mg total) by mouth daily. 360 tablet 3  ? omeprazole (PRILOSEC) 40 MG capsule Take 40 mg by mouth every evening.     ?  rosuvastatin (CRESTOR) 10 MG tablet Take 10 mg by mouth every evening.     ? tamsulosin (FLOMAX) 0.4 MG CAPS capsule Take 0.4 mg by mouth daily.    ? ?No current facility-administered medications on file prior to visit.  ? ? ?Allergies  ?Allergen Reactions  ? Codeine Other (See Comments)  ?  Heart races  ? ? ?Family History  ?Problem Relation Age of Onset  ? Diabetes Brother   ? Non-Hodgkin's lymphoma Father   ? Breast cancer Paternal Aunt   ? Breast cancer Paternal Aunt   ? Prostate cancer Neg Hx   ? Pancreatic cancer Neg Hx   ? Colon cancer Neg Hx   ? ? ?BP (!) 142/82   Pulse 83   Ht 6' (1.829 m)   Wt (!) 332 lb 6.4 oz (150.8 kg)   SpO2 96%   BMI 45.08 kg/m?  ? ? ?Review of Systems ?Denies N/V/HB.   ?   ?Objective:  ? Physical Exam ?VITAL SIGNS:  See vs page ?GENERAL: no  distress ?Pulses: dorsalis pedis intact bilat.   ?MSK: no deformity of the feet ?CV: 2+ bilat leg edema ?Skin:  no ulcer on the feet.  normal color and temp on the feet.  ?Neuro: sensation is intact to touch on the feet, but decreased from normal.   ? ? ?Lab Results  ?Component Value Date  ? HGBA1C 6.8 (A) 01/15/2022  ? ?   ?Assessment & Plan:  ?Type 2 DM: uncontrolled, as goal A1c is low 6's ? ?Patient Instructions  ?I have sent a prescription to your pharmacy, to add bromocriptine.   ?Please continue the same other medications.   ?Please come back for a follow-up appointment in 4 months.   ? ? ? ?

## 2022-01-15 NOTE — Patient Instructions (Addendum)
I have sent a prescription to your pharmacy, to add bromocriptine.   ?Please continue the same other medications.   ?Please come back for a follow-up appointment in 4 months.   ? ?

## 2022-01-26 ENCOUNTER — Other Ambulatory Visit: Payer: Self-pay | Admitting: Endocrinology

## 2022-05-20 ENCOUNTER — Telehealth: Payer: Self-pay

## 2022-05-20 NOTE — Telephone Encounter (Signed)
Attempted to contact the patient in regards to rescheduling with another provider, LVM for a call back. 

## 2022-08-05 ENCOUNTER — Encounter: Payer: Self-pay | Admitting: Internal Medicine

## 2022-08-05 ENCOUNTER — Ambulatory Visit (INDEPENDENT_AMBULATORY_CARE_PROVIDER_SITE_OTHER): Payer: Medicare Other | Admitting: Internal Medicine

## 2022-08-05 VITALS — BP 136/82 | HR 90 | Ht 72.0 in | Wt 336.0 lb

## 2022-08-05 DIAGNOSIS — E119 Type 2 diabetes mellitus without complications: Secondary | ICD-10-CM

## 2022-08-05 DIAGNOSIS — E1165 Type 2 diabetes mellitus with hyperglycemia: Secondary | ICD-10-CM | POA: Diagnosis not present

## 2022-08-05 LAB — POCT GLYCOSYLATED HEMOGLOBIN (HGB A1C): Hemoglobin A1C: 7.6 % — AB (ref 4.0–5.6)

## 2022-08-05 LAB — POCT GLUCOSE (DEVICE FOR HOME USE): Glucose Fasting, POC: 162 mg/dL — AB (ref 70–99)

## 2022-08-05 MED ORDER — SEMAGLUTIDE (1 MG/DOSE) 4 MG/3ML ~~LOC~~ SOPN: 1.0000 mg | PEN_INJECTOR | SUBCUTANEOUS | 3 refills | Status: DC

## 2022-08-05 MED ORDER — METFORMIN HCL ER 500 MG PO TB24: 2000.0000 mg | ORAL_TABLET | Freq: Every day | ORAL | 3 refills | Status: DC

## 2022-08-05 MED ORDER — GLIPIZIDE 5 MG PO TABS: 5.0000 mg | ORAL_TABLET | Freq: Every day | ORAL | 3 refills | Status: DC

## 2022-08-05 MED ORDER — DAPAGLIFLOZIN PROPANEDIOL 5 MG PO TABS: 5.0000 mg | ORAL_TABLET | Freq: Every day | ORAL | 11 refills | Status: DC

## 2022-08-05 NOTE — Patient Instructions (Addendum)
Start Glipizide 5 mg, 1 tablet before Breakfast  Continue Metformin 500 mg XR  ,2  tablets  twice daily  STOP Bromocriptine  Will switch Trulicity to [employer] 5 mg daily     HOW TO TREAT LOW BLOOD SUGARS (Blood sugar LESS THAN 70 MG/DL) Please follow the RULE OF 15 for the treatment of hypoglycemia treatment (when your (blood sugars are less than 70 mg/dL)   STEP 1: Take 15 grams of carbohydrates when your blood sugar is low, which includes:  3-4 GLUCOSE TABS  OR 3-4 OZ OF JUICE OR REGULAR SODA OR ONE TUBE OF GLUCOSE GEL    STEP 2: RECHECK blood sugar in 15 MINUTES STEP 3: If your blood sugar is still low at the 15 minute recheck --> then, go back to STEP 1 and treat AGAIN with another 15 grams of carbohydrates.

## 2022-08-05 NOTE — Progress Notes (Unsigned)
Name: Tony Griffin.  Age/ Sex: 66 y.o., male   MRN/ DOB: [id number], 1956/06/30     PCP: Milford Cage, PA   Reason for Endocrinology Evaluation: Type 2 Diabetes Mellitus  Initial Endocrine Consultative Visit: 07/01/2018    PATIENT IDENTIFIER: Tony Griffin. is a 66 y.o. male with a past medical history of DM, HTN, Hx of prostate cancer . The patient has followed with Endocrinology clinic since 07/01/2018 for consultative assistance with management of his diabetes.  DIABETIC HISTORY:  Tony Griffin was diagnosed with DM 2017, intolerant to [place] due to UTI's . His hemoglobin A1c has ranged from 5.9% in 2020, peaking at 9.8% in 2019.   He was seen by Dr. Loanne Drilling from 2019 until 01/2022 SUBJECTIVE:   During the last visit (01/15/2022): Saw Dr. Loanne Drilling       Today (08/06/2022): Tony Griffin is here for a follow up on diabetes management,.  He checks his blood sugars 1 times month. The patient has not had hypoglycemic episodes since the last clinic visit.   He was on Ozempic which helped with glycemic  control but due to shortage he was switched to Trulicity but did not that it was effective.  Trulicity $294 , Bromocriptine $30 and Wilder Glade is $150 a month.  He follows with urology for  Hx of prostate cancer  Has abnormal sensation between toes    HOME DIABETES REGIMEN:  Metformin 500 mg XR  ,2  tabs BID  Bromocriptine 2.'5mg'$ , half a tab at bedtime  Trulicity 4.5 mg weekly  Farxiga 5 mg daily      Statin: yes ACE-I/ARB: yes    METER DOWNLOAD SUMMARY: Did not bring    DIABETIC COMPLICATIONS: Microvascular complications:   Denies: CKD, neuropathy Last Eye Exam: Completed 2022  Macrovascular complications:   Denies: CAD, CVA, PVD   HISTORY:  Past Medical History:  Past Medical History:  Diagnosis Date   At risk for sleep apnea    STOP--BANG SCORE= 6  (sent to pt's pcp 10-12-2018)   Dyslipidemia    HTN (hypertension)    Hyperplasia of prostate with lower  urinary tract symptoms (LUTS)    Prostate cancer [facility]) urologist-- dr bell/  oncologist-- dr Tammi Klippel   dx 06-23-2018--- Stage T1c,  Gleason 4+3,  PSA 8.8--- plan IMRT    Type 2 diabetes mellitus [facility])    endocrinologist-- dr Loanne Drilling   Wears glasses    Past Surgical History:  Past Surgical History:  Procedure Laterality Date   GOLD SEED IMPLANT N/A 10/19/2018   Procedure: GOLD SEED IMPLANT;  Surgeon: Ceasar Mons, MD;  [facility];  Service: Urology;  Laterality: N/A;   NO PAST SURGERIES     PROSTATE BIOPSY  06-23-2018   dr bell office   SPACE OAR INSTILLATION N/A 10/19/2018   Procedure: SPACE OAR INSTILLATION;  Surgeon: Ceasar Mons, MD;  Location: [facility];  Service: Urology;  Laterality: N/A;   Social History:  reports that he has never smoked. He has never used smokeless tobacco. He reports current alcohol use. He reports that he does not use drugs. Family History:  Family History  Problem Relation Age of Onset   Diabetes Brother    Non-Hodgkin's lymphoma Father    Breast cancer Paternal Aunt    Breast cancer Paternal Aunt    Prostate cancer Neg Hx    Pancreatic cancer Neg Hx    Colon cancer Neg Hx      HOME  MEDICATIONS: Allergies as of 08/05/2022       Reactions   Codeine Other (See Comments)   Heart races        Medication List        Accurate as of August 05, 2022 11:59 PM. If you have any questions, ask your nurse or doctor.          STOP taking these medications    Trulicity 4.5 IW/5.8KD Sopn Generic drug: Dulaglutide Stopped by: Dorita Sciara, MD       TAKE these medications    bromocriptine 2.5 MG tablet Commonly known as: PARLODEL Take 0.5 tablets (1.25 mg total) by mouth at bedtime.   dapagliflozin propanediol 5 MG Tabs tablet Commonly known as: Farxiga Take 1 tablet (5 mg total) by mouth daily before breakfast. What changed: how much to take Changed by:  Dorita Sciara, MD   glipiZIDE 5 MG tablet Commonly known as: GLUCOTROL Take 1 tablet (5 mg total) by mouth daily before breakfast. Started by: Dorita Sciara, MD   glucose blood test strip Commonly known as: Contour Test 1 each by Other route daily. And lancets 1/day   lisinopril 10 MG tablet Commonly known as: ZESTRIL Take 10 mg by mouth every evening.   MAGNESIUM PO Take 1 tablet by mouth daily.   metFORMIN 500 MG 24 hr tablet Commonly known as: GLUCOPHAGE-XR Take 4 tablets (2,000 mg total) by mouth daily.   omeprazole 40 MG capsule Commonly known as: PRILOSEC Take 40 mg by mouth every evening.   rosuvastatin 10 MG tablet Commonly known as: CRESTOR Take 10 mg by mouth every evening.   Semaglutide (1 MG/DOSE) 4 MG/3ML Sopn Inject 1 mg as directed once a week. Started by: Dorita Sciara, MD   tamsulosin 0.4 MG Caps capsule Commonly known as: FLOMAX Take 0.4 mg by mouth daily.   VITAMIN D-3 PO Take 1 capsule by mouth daily.         OBJECTIVE:   Vital Signs: BP 136/82 (BP Location: Left Arm, Patient Position: Sitting, Cuff Size: Large)   Pulse 90   Ht 6' (1.829 m)   Wt (!) 336 lb (152.4 kg)   SpO2 97%   BMI 45.57 kg/m   Wt Readings from Last 3 Encounters:  08/05/22 (!) 336 lb (152.4 kg)  01/15/22 (!) 332 lb 6.4 oz (150.8 kg)  10/16/21 (!) 334 lb (151.5 kg)     Exam: General: Pt appears well and is in NAD  Neck: General: Supple without adenopathy. Thyroid: Thyroid size normal.  No goiter or nodules appreciated.   Lungs: Clear with good BS bilat   Heart: RRR   Extremities: Trace  pretibial edema.   Neuro: MS is good with appropriate affect, pt is alert and Ox3     DATA REVIEWED:  Lab Results  Component Value Date   HGBA1C 7.6 (A) 08/05/2022   HGBA1C 6.8 (A) 01/15/2022   HGBA1C 7.1 (A) 10/16/2021      ASSESSMENT / PLAN / RECOMMENDATIONS:   1) Type 2 Diabetes Mellitus, Sub-OPtimally controlled, Without  complications -  Most recent A1c of 7.6 %. Goal A1c < 7.0 %.    -A1c has trended upwards from 6.8% to 7.6% - Medications have been cost prohibitive a per HPI - Will stop Bromocriptine  - Will start Glipizide, cautioned about hypoglycemia, he was advised to take it 15-20 minutes before first meal of the day  - He was provided with pt assistance for Ozempic and Farxiga -  Unable to increase [place] due to hx of UTI ( he had 2 UTI's over the past 2 years, I explained to the pt the concern if he has recurrent  UTI's within 6 months. - Encouraged the to check glucose 2-3 times a week -Patient gets his labs done at PCPs office   MEDICATIONS: Stop bromocriptine Start glipizide 5 mg daily Continue metformin 500 mg XR, 2 tabs twice daily We will switch Trulicity to Ozempic 1 mg weekly (if approved for patient assistance) Continue Farxiga 5 mg daily  EDUCATION / INSTRUCTIONS: BG monitoring instructions: Patient is instructed to check his blood sugars 2-3 times a week. Call [place] [facility] if: BG persistently < 70  I reviewed the Rule of 15 for the treatment of hypoglycemia in detail with the patient. Literature supplied.    2) Diabetic complications:  Eye: Does not have known diabetic retinopathy.  Neuro/ Feet: Does not have known diabetic peripheral neuropathy .  Renal: Patient does not have known baseline CKD. He   is on an ACEI/ARB at present.     F/U in 4 months   Signed electronically by: Mack Guise, MD  [facility] Endocrinology  [facility] [address] [place], [place] [address] Phone: [phone] FAX: [phone]   CC: [address], [place] [address] Phone: [phone]  Fax: [phone]  Return to Endocrinology clinic as below: Future Appointments  Date Time Provider [place]  12/17/2022  8:10 AM Bogdan Vivona, Melanie Crazier, MD [id number] None

## 2022-08-06 DIAGNOSIS — E1165 Type 2 diabetes mellitus with hyperglycemia: Secondary | ICD-10-CM | POA: Insufficient documentation

## 2022-10-06 ENCOUNTER — Ambulatory Visit: Payer: BLUE CROSS/BLUE SHIELD | Admitting: Internal Medicine

## 2022-12-17 ENCOUNTER — Ambulatory Visit: Payer: BLUE CROSS/BLUE SHIELD | Admitting: Internal Medicine

## 2023-02-12 ENCOUNTER — Ambulatory Visit (INDEPENDENT_AMBULATORY_CARE_PROVIDER_SITE_OTHER): Payer: Medicare Other | Admitting: Internal Medicine

## 2023-02-12 ENCOUNTER — Encounter: Payer: Self-pay | Admitting: Internal Medicine

## 2023-02-12 VITALS — BP 136/88 | HR 84 | Ht 72.0 in | Wt 338.0 lb

## 2023-02-12 DIAGNOSIS — E1142 Type 2 diabetes mellitus with diabetic polyneuropathy: Secondary | ICD-10-CM

## 2023-02-12 LAB — POCT GLYCOSYLATED HEMOGLOBIN (HGB A1C): Hemoglobin A1C: 6.6 % — AB (ref 4.0–5.6)

## 2023-02-12 MED ORDER — METFORMIN HCL ER 500 MG PO TB24: 2000.0000 mg | ORAL_TABLET | Freq: Every day | ORAL | 3 refills | Status: DC

## 2023-02-12 MED ORDER — GLIPIZIDE 5 MG PO TABS: 5.0000 mg | ORAL_TABLET | Freq: Every day | ORAL | 3 refills | Status: DC

## 2023-02-12 MED ORDER — TIRZEPATIDE 5 MG/0.5ML ~~LOC~~ SOAJ: 5.0000 mg | SUBCUTANEOUS | 3 refills | Status: DC

## 2023-02-12 NOTE — Progress Notes (Signed)
Name: Tony MoritaJohn Maher Jr.  Age/ Sex: 67 y.o., male   MRN/ DOB: [id number], [birth year]     PCP: Paschal Dopparroll, Michael J, PA   Reason for Endocrinology Evaluation: Type 2 Diabetes Mellitus  Initial Endocrine Consultative Visit: 07/01/2018    PATIENT IDENTIFIER: [name] is a 67 y.o. male with a past medical history of DM, HTN, Hx of prostate cancer . The patient has followed with Endocrinology clinic since 07/01/2018 for consultative assistance with management of his diabetes.  DIABETIC HISTORY:  [name] was diagnosed with DM 2017, intolerant to [place]xiga due to UTI's . His hemoglobin A1c has ranged from 5.9% in 2020, peaking at 9.8% in 2019.   He was seen by Dr. Everardo AllEllison from 2019 until 01/2022  On his initial visit with me 07/2022 his A1c 7.6 % , he was on Bromocriptine, Metformin , Trulicity and [place]xiga. We stopped Bromocriptine, started Glipizide, switched Trulicity to Ozempic and continued farxiga and Metformin    He has had 2 UTI's on Farxiga 10 mg , but stopped 11/2022 due to cost   SUBJECTIVE:   During the last visit (08/05/2022): A1c 7.6%   Today (02/12/2023): [name] is here for a follow up on diabetes management,.  He checks his blood sugars 1 times month. The patient has not had hypoglycemic episodes since the last clinic visit.  He has not been able to get the [place]xiga due to cost  Unable to qualify for pt assistance   He follows with urology for  Hx of prostate cancer  Denies nausea, vomiting or diarrhea   HOME DIABETES REGIMEN:  Metformin 500 mg XR  ,2  tabs BID  Glipizide 5 mg daily  Trulicity 4.5 mg weekly      Statin: yes ACE-I/ARB: yes    METER DOWNLOAD SUMMARY: Did not bring    DIABETIC COMPLICATIONS: Microvascular complications:   Denies: CKD, neuropathy Last Eye Exam: Completed 2022  Macrovascular complications:   Denies: CAD, CVA, PVD   HISTORY:  Past Medical History:  Past Medical History:  Diagnosis Date   At risk for sleep apnea     STOP--BANG SCORE= 6  (sent to pt's pcp 10-12-2018)   Dyslipidemia    HTN (hypertension)    Hyperplasia of prostate with lower urinary tract symptoms (LUTS)    Prostate cancer urologist-- dr bell/  oncologist-- dr Kathrynn Runningmanning   dx 06-23-2018--- Stage T1c,  Gleason 4+3,  PSA 8.8--- plan IMRT    Type 2 diabetes mellitus    endocrinologist-- dr Everardo Allellison   Wears glasses    Past Surgical History:  Past Surgical History:  Procedure Laterality Date   GOLD SEED IMPLANT N/A 10/19/2018   Procedure: GOLD SEED IMPLANT;  Surgeon: Rene PaciWinter, Christopher Aaron, MD;  [facility] [place];  Service: Urology;  Laterality: N/A;   NO PAST SURGERIES     PROSTATE BIOPSY  06-23-2018   dr bell office   SPACE OAR INSTILLATION N/A 10/19/2018   Procedure: SPACE OAR INSTILLATION;  Surgeon: Rene PaciWinter, Christopher Aaron, MD;  [facility] [place];  Service: Urology;  Laterality: N/A;   Social History:  reports that he has never smoked. He has never used smokeless tobacco. He reports current alcohol use. He reports that he does not use drugs. Family History:  Family History  Problem Relation Age of Onset   Diabetes Brother    Non-Hodgkin's lymphoma Father    Breast cancer Paternal Aunt    Breast cancer Paternal Aunt    Prostate cancer  Neg Hx    Pancreatic cancer Neg Hx    Colon cancer Neg Hx      HOME MEDICATIONS: Allergies as of 02/12/2023       Reactions   Codeine Other (See Comments)   Heart races        Medication List        Accurate as of February 12, 2023  1:42 PM. If you have any questions, ask your nurse or doctor.          dapagliflozin propanediol 5 MG Tabs tablet Commonly known as: Farxiga Take 1 tablet (5 mg total) by mouth daily before breakfast.   glipiZIDE 5 MG tablet Commonly known as: GLUCOTROL Take 1 tablet (5 mg total) by mouth daily before breakfast.   glucose blood test strip Commonly known as: Contour Test 1 each by Other route daily. And lancets  1/day   lisinopril 10 MG tablet Commonly known as: ZESTRIL Take 10 mg by mouth every evening.   MAGNESIUM PO Take 1 tablet by mouth daily.   metFORMIN 500 MG 24 hr tablet Commonly known as: GLUCOPHAGE-XR Take 4 tablets (2,000 mg total) by mouth daily.   omeprazole 40 MG capsule Commonly known as: PRILOSEC Take 40 mg by mouth every evening.   rosuvastatin 10 MG tablet Commonly known as: CRESTOR Take 10 mg by mouth every evening.   Semaglutide (1 MG/DOSE) 4 MG/3ML Sopn Inject 1 mg as directed once a week.   tamsulosin 0.4 MG Caps capsule Commonly known as: FLOMAX Take 0.4 mg by mouth daily.   VITAMIN D-3 PO Take 1 capsule by mouth daily.         OBJECTIVE:   Vital Signs: BP 136/88   Pulse 84   Ht 6' (1.829 m)   Wt (!) 338 lb (153.3 kg)   SpO2 97%   BMI 45.84 kg/m   Wt Readings from Last 3 Encounters:  02/12/23 (!) 338 lb (153.3 kg)  08/05/22 (!) 336 lb (152.4 kg)  01/15/22 (!) 332 lb 6.4 oz (150.8 kg)     Exam: General: Pt appears well and is in NAD  Neck: General: Supple without adenopathy. Thyroid: Thyroid size normal.  No goiter or nodules appreciated.   Lungs: Clear with good BS bilat   Heart: RRR   Extremities: Trace  pretibial edema.   Neuro: MS is good with appropriate affect, pt is alert and Ox3   DM Foot Exam 02/12/2023 The skin of the feet is without sores or ulcerations, with thickened nails  The pedal pulses are 2+ on right and 2+ on left. The sensation is decreased  to a screening 5.07, 10 gram monofilament bilaterally   DATA REVIEWED:  Lab Results  Component Value Date   HGBA1C 7.6 (A) 08/05/2022   HGBA1C 6.8 (A) 01/15/2022   HGBA1C 7.1 (A) 10/16/2021      ASSESSMENT / PLAN / RECOMMENDATIONS:   1) Type 2 Diabetes Mellitus, OPtimally controlled, With  Neuropathic  complications - Most recent A1c of 6.6 %. Goal A1c < 7.0 %.    -I have praised the patient improved glycemic control, A1c decreased from 7.6% to 6.6% -He was unable  to tolerate Farxiga 10 mg due to recurrent UTIs, and the 5 mg tablets became cost prohibitive by January 2024 -He would like a refill on Trulicity, but I have recommended switching to [facility] if this is covered by his insurance -Discussed the importance of frequent glucose checks, at least once weekly not once a month -Discussed risk  of hypoglycemia with glipizide, if hypoglycemia ensues he will need to stop it -Patient has his PCP through [facility], and they perform his labs, records not available  MEDICATIONS: Continue glipizide 5 mg daily Continue metformin 500 mg XR, 2 tabs twice daily Will switch Trulicity to Mounjaro 5 mg weekly   EDUCATION / INSTRUCTIONS: BG monitoring instructions: Patient is instructed to check his blood sugars 1 times a week. Call [place] [facility] if: BG persistently < 70  I reviewed the Rule of 15 for the treatment of hypoglycemia in detail with the patient. Literature supplied.    2) Diabetic complications:  Eye: Does not have known diabetic retinopathy.  Neuro/ Feet: Does  have known diabetic peripheral neuropathy .  Renal: Patient does not have known baseline CKD. He   is on an ACEI/ARB at present.     F/U in 4 months   Signed electronically by: Lyndle Herrlich, MD  [facility] Endocrinology  [facility] [address] [place] [place], [place] [address] Phone: [phone] FAX: [phone]   CC: Paschal Dopp, [place] [address] [place] [address] Phone: [phone]  Fax: [phone]  Return to Endocrinology clinic as below: No future appointments.

## 2023-02-12 NOTE — Patient Instructions (Signed)
Continue  Glipizide 5 mg, 1 tablet before Breakfast  Continue Metformin 500 mg XR  ,2  tablets  twice daily  Will switch Trulicity to [employer] 5 mg weekly      HOW TO TREAT LOW BLOOD SUGARS (Blood sugar LESS THAN 70 MG/DL) Please follow the RULE OF 15 for the treatment of hypoglycemia treatment (when your (blood sugars are less than 70 mg/dL)   STEP 1: Take 15 grams of carbohydrates when your blood sugar is low, which includes:  3-4 GLUCOSE TABS  OR 3-4 OZ OF JUICE OR REGULAR SODA OR ONE TUBE OF GLUCOSE GEL    STEP 2: RECHECK blood sugar in 15 MINUTES STEP 3: If your blood sugar is still low at the 15 minute recheck --> then, go back to STEP 1 and treat AGAIN with another 15 grams of carbohydrates.

## 2023-04-25 ENCOUNTER — Other Ambulatory Visit: Payer: Self-pay | Admitting: Internal Medicine

## 2023-04-25 DIAGNOSIS — Z0000 Encounter for general adult medical examination without abnormal findings: Secondary | ICD-10-CM

## 2023-08-19 ENCOUNTER — Ambulatory Visit: Payer: Medicare Other | Admitting: Internal Medicine

## 2023-08-19 ENCOUNTER — Encounter: Payer: Self-pay | Admitting: Internal Medicine

## 2023-08-19 VITALS — BP 120/70 | HR 56 | Ht 72.0 in | Wt 350.0 lb

## 2023-08-19 DIAGNOSIS — Z7984 Long term (current) use of oral hypoglycemic drugs: Secondary | ICD-10-CM | POA: Diagnosis not present

## 2023-08-19 DIAGNOSIS — Z7985 Long-term (current) use of injectable non-insulin antidiabetic drugs: Secondary | ICD-10-CM | POA: Diagnosis not present

## 2023-08-19 DIAGNOSIS — E1142 Type 2 diabetes mellitus with diabetic polyneuropathy: Secondary | ICD-10-CM

## 2023-08-19 DIAGNOSIS — E1165 Type 2 diabetes mellitus with hyperglycemia: Secondary | ICD-10-CM

## 2023-08-19 LAB — POCT GLUCOSE (DEVICE FOR HOME USE): POC Glucose: 173 mg/dL — AB (ref 70–99)

## 2023-08-19 LAB — POCT GLYCOSYLATED HEMOGLOBIN (HGB A1C): Hemoglobin A1C: 7.6 % — AB (ref 4.0–5.6)

## 2023-08-19 MED ORDER — METFORMIN HCL ER 500 MG PO TB24: 2000.0000 mg | ORAL_TABLET | Freq: Every day | ORAL | 3 refills | Status: DC

## 2023-08-19 MED ORDER — TIRZEPATIDE 7.5 MG/0.5ML ~~LOC~~ SOAJ: 7.5000 mg | SUBCUTANEOUS | 3 refills | Status: DC

## 2023-08-19 MED ORDER — GLIPIZIDE 5 MG PO TABS: 5.0000 mg | ORAL_TABLET | Freq: Two times a day (BID) | ORAL | 3 refills | Status: DC

## 2023-08-19 NOTE — Patient Instructions (Signed)
Increase Glipizide 5mg , 1 tablet before Breakfast and 1 tablet before Supper , while on Mounjaro 5 mg   Decrease Glipizide 5 mg, to 1 tablet before Breakfast , when you start Mounjaro 7.5 mg weekly   Continue Metformin 500 mg XR  ,2  tablets  twice daily       HOW TO TREAT LOW BLOOD SUGARS (Blood sugar LESS THAN 70 MG/DL) Please follow the RULE OF 15 for the treatment of hypoglycemia treatment (when your (blood sugars are less than 70 mg/dL)   STEP 1: Take 15 grams of carbohydrates when your blood sugar is low, which includes:  3-4 GLUCOSE TABS  OR 3-4 OZ OF JUICE OR REGULAR SODA OR ONE TUBE OF GLUCOSE GEL    STEP 2: RECHECK blood sugar in 15 MINUTES STEP 3: If your blood sugar is still low at the 15 minute recheck --> then, go back to STEP 1 and treat AGAIN with another 15 grams of carbohydrates.

## 2023-08-19 NOTE — Progress Notes (Signed)
Name: Tony Griffin.  Age/ Sex: 67 y.o., male   MRN/ DOB: [id number], Feb 12, 1956     PCP: Paschal Dopp, PA   Reason for Endocrinology Evaluation: Type 2 Diabetes Mellitus  Initial Endocrine Consultative Visit: 07/01/2018    PATIENT IDENTIFIER: Tony Griffin. is a 67 y.o. male with a past medical history of DM, HTN, Hx of prostate cancer . The patient has followed with Endocrinology clinic since 07/01/2018 for consultative assistance with management of his diabetes.  DIABETIC HISTORY:  Mr. Springs was diagnosed with DM 2017, intolerant to [place] due to UTI's . His hemoglobin A1c has ranged from 5.9% in 2020, peaking at 9.8% in 2019.   He was seen by Dr. Everardo All from 2019 until 01/2022  On his initial visit with me 07/2022 his A1c 7.6 % , he was on Bromocriptine, Metformin , Trulicity and [place]. We stopped Bromocriptine, started Glipizide, switched Trulicity to Ozempic and continued farxiga and Metformin    He has had 2 UTI's on Farxiga 10 mg , but stopped 11/2022 due to cost   Switch Trulicity to Mounjaro/2024  SUBJECTIVE:   During the last visit (02/12/2023): A1c 6.6%   Today (08/19/2023): [name] is here for a follow up on diabetes management,.  He checks his blood sugars 0 times month.   He has been noted with weight gain He denies any nausea or vomiting He denies any diarrhea, has rare constipation patient, uses Colace when needed  Recent Cologuard negative He follows with urology for  Hx of prostate cancer    HOME DIABETES REGIMEN:  Metformin 500 mg XR  ,2  tabs BID  Glipizide 5 mg daily  Mounjaro 5 mg weekly      Statin: yes ACE-I/ARB: yes    METER DOWNLOAD SUMMARY: Did not bring    DIABETIC COMPLICATIONS: Microvascular complications:   Denies: CKD, neuropathy Last Eye Exam: Completed 2022  Macrovascular complications:   Denies: CAD, CVA, PVD   HISTORY:  Past Medical History:  Past Medical History:  Diagnosis Date   At risk for sleep  apnea    STOP--BANG SCORE= 6  (sent to pt's pcp 10-12-2018)   Dyslipidemia    HTN (hypertension)    Hyperplasia of prostate with lower urinary tract symptoms (LUTS)    Prostate cancer [facility]) urologist-- dr bell/  oncologist-- dr Kathrynn Running   dx 06-23-2018--- Stage T1c,  Gleason 4+3,  PSA 8.8--- plan IMRT    Type 2 diabetes mellitus [facility])    endocrinologist-- dr Everardo All   Wears glasses    Past Surgical History:  Past Surgical History:  Procedure Laterality Date   GOLD SEED IMPLANT N/A 10/19/2018   Procedure: GOLD SEED IMPLANT;  Surgeon: Rene Paci, MD;  [facility];  Service: Urology;  Laterality: N/A;   NO PAST SURGERIES     PROSTATE BIOPSY  06-23-2018   dr bell office   SPACE OAR INSTILLATION N/A 10/19/2018   Procedure: SPACE OAR INSTILLATION;  Surgeon: Rene Paci, MD;  [facility];  Service: Urology;  Laterality: N/A;   Social History:  reports that he has never smoked. He has never used smokeless tobacco. He reports current alcohol use. He reports that he does not use drugs. Family History:  Family History  Problem Relation Age of Onset   Diabetes Brother    Non-Hodgkin's lymphoma Father    Breast cancer Paternal Aunt    Breast cancer Paternal Aunt    Prostate cancer Neg  Hx    Pancreatic cancer Neg Hx    Colon cancer Neg Hx      HOME MEDICATIONS: Allergies as of 08/19/2023       Reactions   Codeine Other (See Comments)   Heart races        Medication List        Accurate as of August 19, 2023  1:08 PM. If you have any questions, ask your nurse or doctor.          STOP taking these medications    tirzepatide 5 MG/0.5ML Pen Commonly known as: MOUNJARO Replaced by: tirzepatide 7.5 MG/0.5ML Pen Stopped by: Johnney Ou Aspen Lawrance   Trulicity 4.5 MG/0.5ML Sopn Generic drug: Dulaglutide Stopped by: Johnney Ou Shanetha Bradham       TAKE these medications    glipiZIDE 5 MG  tablet Commonly known as: GLUCOTROL Take 1 tablet (5 mg total) by mouth 2 (two) times daily before a meal. What changed: when to take this Changed by: Johnney Ou Dareion Kneece   glucose blood test strip Commonly known as: Contour Test 1 each by Other route daily. And lancets 1/day   lisinopril 10 MG tablet Commonly known as: ZESTRIL Take 10 mg by mouth every evening.   MAGNESIUM PO Take 1 tablet by mouth daily.   metFORMIN 500 MG 24 hr tablet Commonly known as: GLUCOPHAGE-XR Take 4 tablets (2,000 mg total) by mouth daily.   omeprazole 40 MG capsule Commonly known as: PRILOSEC Take 40 mg by mouth every evening.   rosuvastatin 10 MG tablet Commonly known as: CRESTOR Take 10 mg by mouth every evening.   tamsulosin 0.4 MG Caps capsule Commonly known as: FLOMAX Take 0.4 mg by mouth daily.   tirzepatide 7.5 MG/0.5ML Pen Commonly known as: MOUNJARO Inject 7.5 mg into the skin once a week. Replaces: tirzepatide 5 MG/0.5ML Pen Started by: Scarlette Shorts   VITAMIN D-3 PO Take 1 capsule by mouth daily.         OBJECTIVE:   Vital Signs: BP 120/70 (BP Location: Left Arm, Patient Position: Sitting, Cuff Size: Large)   Pulse (!) 56   Ht 6' (1.829 m)   Wt (!) 350 lb (158.8 kg)   SpO2 96%   BMI 47.47 kg/m   Wt Readings from Last 3 Encounters:  08/19/23 (!) 350 lb (158.8 kg)  02/12/23 (!) 338 lb (153.3 kg)  08/05/22 (!) 336 lb (152.4 kg)     Exam: General: Pt appears well and is in NAD  Lungs: Clear with good BS bilat   Heart: RRR   Extremities: 1+  pretibial edema.   Neuro: MS is good with appropriate affect, pt is alert and Ox3   DM Foot Exam 08/19/2023 The skin of the feet is without sores or ulcerations, with thickened nails  The pedal pulses are 2+ on right and 2+ on left. The sensation is decreased  to a screening 5.07, 10 gram monofilament bilaterally   DATA REVIEWED:  Lab Results  Component Value Date   HGBA1C 7.6 (A) 08/19/2023   HGBA1C 6.6  (A) 02/12/2023   HGBA1C 7.6 (A) 08/05/2022      ASSESSMENT / PLAN / RECOMMENDATIONS:   1) Type 2 Diabetes Mellitus, Sub-OPtimally controlled, With  Neuropathic  complications - Most recent A1c of 7.6%. Goal A1c < 7.0 %.    -His A1c has increased from 6.6% to 7.6% -I did explain to the patient that he is on a small dose of Mounjaro which she seems to be  tolerating this better than Trulicity or Ozempic -He is in the coverage gap at this time, and had to pay $700 for 40-month supply, I will increase his Mounjaro to 7.5 mg, but he will pick this up in January 2025 -In the meantime he was advised to increase glipizide to twice daily, since he will be using the 5 mg Mounjaro, once he goes to 7.5 mg dose of Mounjaro, he will decrease Ozempic to once a day -He was unable to tolerate Farxiga 10 mg due to recurrent UTIs, and the 5 mg tablets became cost prohibitive by January 2024 -Patient has his PCP through [facility], and they perform his labs, records not available  MEDICATIONS: Continue glipizide 5 mg daily(this will be temporarily increased to twice daily while on the 5 mg of Mounjaro) Continue metformin 500 mg XR, 2 tabs twice daily Increase Mounjaro 7.5 mg weekly   EDUCATION / INSTRUCTIONS: BG monitoring instructions: Patient is instructed to check his blood sugars 1 times a week. Call [place] [facility] if: BG persistently < 70  I reviewed the Rule of 15 for the treatment of hypoglycemia in detail with the patient. Literature supplied.    2) Diabetic complications:  Eye: Does not have known diabetic retinopathy.  Neuro/ Feet: Does  have known diabetic peripheral neuropathy .  Renal: Patient does not have known baseline CKD. He   is on an ACEI/ARB at present.     F/U in 4 months   Signed electronically by: Lyndle Herrlich, MD  [facility] Endocrinology  [facility] [address] [place] [place], [place] [address] Phone: [phone] FAX:  [phone]   CC: Paschal Dopp, [place] [address] [place] [address] Phone: [phone]  Fax: [phone]  Return to Endocrinology clinic as below: Future Appointments  Date Time Provider Department Center  01/18/2024  9:30 AM Hassell Patras, Konrad Dolores, MD [id number] None

## 2024-01-18 ENCOUNTER — Encounter: Payer: Self-pay | Admitting: Internal Medicine

## 2024-01-18 ENCOUNTER — Ambulatory Visit (INDEPENDENT_AMBULATORY_CARE_PROVIDER_SITE_OTHER): Payer: Medicare Other | Admitting: Internal Medicine

## 2024-01-18 VITALS — BP 126/80 | HR 96 | Ht 72.0 in | Wt 352.0 lb

## 2024-01-18 DIAGNOSIS — E1142 Type 2 diabetes mellitus with diabetic polyneuropathy: Secondary | ICD-10-CM

## 2024-01-18 DIAGNOSIS — Z7985 Long-term (current) use of injectable non-insulin antidiabetic drugs: Secondary | ICD-10-CM

## 2024-01-18 DIAGNOSIS — Z7984 Long term (current) use of oral hypoglycemic drugs: Secondary | ICD-10-CM | POA: Diagnosis not present

## 2024-01-18 DIAGNOSIS — E1165 Type 2 diabetes mellitus with hyperglycemia: Secondary | ICD-10-CM

## 2024-01-18 LAB — POCT GLYCOSYLATED HEMOGLOBIN (HGB A1C): Hemoglobin A1C: 7.1 % — AB (ref 4.0–5.6)

## 2024-01-18 LAB — POCT GLUCOSE (DEVICE FOR HOME USE): Glucose Fasting, POC: 189 mg/dL — AB (ref 70–99)

## 2024-01-18 MED ORDER — GLIPIZIDE 10 MG PO TABS: 10.0000 mg | ORAL_TABLET | Freq: Two times a day (BID) | ORAL | 3 refills | Status: DC

## 2024-01-18 MED ORDER — SEMAGLUTIDE (2 MG/DOSE) 8 MG/3ML ~~LOC~~ SOPN: 2.0000 mg | PEN_INJECTOR | SUBCUTANEOUS | 3 refills | Status: DC

## 2024-01-18 MED ORDER — METFORMIN HCL ER 500 MG PO TB24: 2000.0000 mg | ORAL_TABLET | Freq: Every day | ORAL | 3 refills | Status: DC

## 2024-01-18 NOTE — Progress Notes (Signed)
 Name: Tony Griffin.  Age/ Sex: 68 y.o., male   MRN/ DOB: [id number], [birth year]     PCP: Paschal Dopp, PA   Reason for Endocrinology Evaluation: Type 2 Diabetes Mellitus  Initial Endocrine Consultative Visit: 07/01/2018    PATIENT IDENTIFIER: [name]. is a 68 y.o. male with a past medical history of DM, HTN, Hx of prostate cancer . The patient has followed with Endocrinology clinic since 07/01/2018 for consultative assistance with management of his diabetes.  DIABETIC HISTORY:  [name] was diagnosed with DM 2017, intolerant to [place] due to UTI's . His hemoglobin A1c has ranged from 5.9% in 2020, peaking at 9.8% in 2019.   He was seen by Dr. Everardo All from 2019 until 01/2022  On his initial visit with me 07/2022 his A1c 7.6 % , he was on Bromocriptine, Metformin , Trulicity and [place]. We stopped Bromocriptine, started Glipizide, switched Trulicity to Ozempic and continued farxiga and Metformin    He has had 2 UTI's on Farxiga 10 mg , but stopped 11/2022 due to cost   Due to shortage of supply of Ozempic he was switched to Trulicity, which was subsequently switched to [facility] 02/2023   Due to cost of Mounjaro and $1160, I switched him back to Ozempic and was provided patient assistance forms  SUBJECTIVE:   During the last visit (08/19/2023): A1c 7.6%   Today (01/18/2024): [name] is here for a follow up on diabetes management,.  He checks his blood sugars 0 times .   He denies any nausea or vomiting Denies constipation or diarrhea   He follows with urology for  Hx of prostate cancer   Mounjaro cost [phone]  He does have abnormal sensation of the feet when he wears socks  HOME DIABETES REGIMEN:  Metformin 500 mg XR  ,2  tabs BID  Glipizide 5 mg daily - still takes 5 mg BID Mounjaro 7.5 mg weekly      Statin: yes ACE-I/ARB: yes    METER DOWNLOAD SUMMARY: Did not bring    DIABETIC COMPLICATIONS: Microvascular complications:   Denies: CKD,  neuropathy Last Eye Exam: Completed 2022  Macrovascular complications:   Denies: CAD, CVA, PVD   HISTORY:  Past Medical History:  Past Medical History:  Diagnosis Date   At risk for sleep apnea    STOP--BANG SCORE= 6  (sent to pt's pcp 10-12-2018)   Dyslipidemia    HTN (hypertension)    Hyperplasia of prostate with lower urinary tract symptoms (LUTS)    Prostate cancer [facility]) urologist-- dr bell/  oncologist-- dr Kathrynn Running   dx 06-23-2018--- Stage T1c,  Gleason 4+3,  PSA 8.8--- plan IMRT    Type 2 diabetes mellitus [facility])    endocrinologist-- dr Everardo All   Wears glasses    Past Surgical History:  Past Surgical History:  Procedure Laterality Date   GOLD SEED IMPLANT N/A 10/19/2018   Procedure: GOLD SEED IMPLANT;  Surgeon: Rene Paci, MD;  Location: [facility];  Service: Urology;  Laterality: N/A;   NO PAST SURGERIES     PROSTATE BIOPSY  06-23-2018   dr bell office   SPACE OAR INSTILLATION N/A 10/19/2018   Procedure: SPACE OAR INSTILLATION;  Surgeon: Rene Paci, MD;  Location: [facility];  Service: Urology;  Laterality: N/A;   Social History:  reports that he has never smoked. He has never used smokeless tobacco. He reports current alcohol use. He reports that he does not use drugs. Family  History:  Family History  Problem Relation Age of Onset   Diabetes Brother    Non-Hodgkin's lymphoma Father    Breast cancer Paternal Aunt    Breast cancer Paternal Aunt    Prostate cancer Neg Hx    Pancreatic cancer Neg Hx    Colon cancer Neg Hx      HOME MEDICATIONS: Allergies as of 01/18/2024       Reactions   Codeine Other (See Comments)   Heart races        Medication List        Accurate as of January 18, 2024  9:31 AM. If you have any questions, ask your nurse or doctor.          glipiZIDE 5 MG tablet Commonly known as: GLUCOTROL Take 1 tablet (5 mg total) by mouth 2 (two) times daily before a meal.    glipiZIDE 10 MG tablet Commonly known as: GLUCOTROL Take 10 mg by mouth daily.   glucose blood test strip Commonly known as: Contour Test 1 each by Other route daily. And lancets 1/day   lisinopril 10 MG tablet Commonly known as: ZESTRIL Take 10 mg by mouth every evening.   MAGNESIUM PO Take 1 tablet by mouth daily.   metFORMIN 500 MG 24 hr tablet Commonly known as: GLUCOPHAGE-XR Take 4 tablets (2,000 mg total) by mouth daily.   omeprazole 40 MG capsule Commonly known as: PRILOSEC Take 40 mg by mouth every evening.   rosuvastatin 10 MG tablet Commonly known as: CRESTOR Take 10 mg by mouth every evening.   tamsulosin 0.4 MG Caps capsule Commonly known as: FLOMAX Take 0.4 mg by mouth daily.   tirzepatide 7.5 MG/0.5ML Pen Commonly known as: MOUNJARO Inject 7.5 mg into the skin once a week. What changed: Another medication with the same name was removed. Continue taking this medication, and follow the directions you see here. Changed by: Johnney Ou Mikaella Escalona   VITAMIN D-3 PO Take 1 capsule by mouth daily.         OBJECTIVE:   Vital Signs: BP 126/80 (BP Location: Left Arm, Patient Position: Sitting, Cuff Size: Small)   Pulse 96   Ht 6' (1.829 m)   Wt (!) 352 lb (159.7 kg)   SpO2 98%   BMI 47.74 kg/m   Wt Readings from Last 3 Encounters:  01/18/24 (!) 352 lb (159.7 kg)  08/19/23 (!) 350 lb (158.8 kg)  02/12/23 (!) 338 lb (153.3 kg)     Exam: General: Pt appears well and is in NAD  Lungs: Clear with good BS bilat   Heart: RRR   Extremities: 1+  pretibial edema.   Neuro: MS is good with appropriate affect, pt is alert and Ox3   DM Foot Exam 01/18/2024 The skin of the feet is without sores or ulcerations, with thickened nails  The pedal pulses are 2+ on right and 2+ on left. The sensation is decreased  to a screening 5.07, 10 gram monofilament bilaterally   DATA REVIEWED:  Lab Results  Component Value Date   HGBA1C 7.6 (A) 08/19/2023   HGBA1C 6.6  (A) 02/12/2023   HGBA1C 7.6 (A) 08/05/2022    In Office BG 189 mg/dL   ASSESSMENT / PLAN / RECOMMENDATIONS:   1) Type 2 Diabetes Mellitus, Sub-OPtimally controlled, With  Neuropathic  complications - Most recent A1c of 7.1%. Goal A1c < 7.0 %.    -A1c is trending down -Patient has been noted weight gain despite being on higher dose  of Harl Favor became cost prohibitive, we have discussed switching to Ozempic, and that way we can apply for patient assistance, he was provided with forms today  -He was unable to tolerate Farxiga 10 mg due to recurrent UTIs -Patient has his PCP through [facility] medical, and they perform his labs, records not available -Will increase glipizide, discussed the importance of taking this 15-20 minutes before the first and last meal of the day  MEDICATIONS: Increase glipizide 10 mg BID Continue metformin 500 mg XR, 2 tabs twice daily Switch Mounjaro to Ozempic 2 mg weekly   EDUCATION / INSTRUCTIONS: BG monitoring instructions: Patient is instructed to check his blood sugars 1 times a week. Call [facility] if: BG persistently < 70  I reviewed the Rule of 15 for the treatment of hypoglycemia in detail with the patient. Literature supplied.    2) Diabetic complications:  Eye: Does not have known diabetic retinopathy.  Neuro/ Feet: Does  have known diabetic peripheral neuropathy .  Renal: Patient does not have known baseline CKD. He   is on an ACEI/ARB at present.     F/U in 4 months   Signed electronically by: Lyndle Herrlich, MD  [facility]  [facility] [address] [place] [place], [place] [address] Phone: [phone] FAX: [phone]   CC: Paschal Dopp, [place] [address] [place] [address] Phone: [phone]  Fax: [phone]  Return to Endocrinology clinic as below: No future appointments.

## 2024-01-18 NOTE — Patient Instructions (Signed)
 Glipizide 10 mg, 1 tablet before Breakfast and 1 tablet before Supper Continue Metformin 500 mg XR  ,2  tablets  twice daily  Will switch Mounjaro to Ozempic 1 mg weekly       HOW TO TREAT LOW BLOOD SUGARS (Blood sugar LESS THAN 70 MG/DL) Please follow the RULE OF 15 for the treatment of hypoglycemia treatment (when your (blood sugars are less than 70 mg/dL)   STEP 1: Take 15 grams of carbohydrates when your blood sugar is low, which includes:  3-4 GLUCOSE TABS  OR 3-4 OZ OF JUICE OR REGULAR SODA OR ONE TUBE OF GLUCOSE GEL    STEP 2: RECHECK blood sugar in 15 MINUTES STEP 3: If your blood sugar is still low at the 15 minute recheck --> then, go back to STEP 1 and treat AGAIN with another 15 grams of carbohydrates.

## 2024-02-01 ENCOUNTER — Other Ambulatory Visit: Payer: Self-pay | Admitting: Internal Medicine

## 2024-02-01 ENCOUNTER — Other Ambulatory Visit: Payer: Self-pay

## 2024-02-01 MED ORDER — SEMAGLUTIDE (2 MG/DOSE) 8 MG/3ML ~~LOC~~ SOPN: 2.0000 mg | PEN_INJECTOR | SUBCUTANEOUS | 1 refills | Status: DC

## 2024-02-07 ENCOUNTER — Other Ambulatory Visit: Payer: Self-pay

## 2024-02-07 MED ORDER — SEMAGLUTIDE (1 MG/DOSE) 4 MG/3ML ~~LOC~~ SOPN: 1.0000 mg | PEN_INJECTOR | SUBCUTANEOUS | 3 refills | Status: DC

## 2024-04-30 ENCOUNTER — Other Ambulatory Visit: Payer: Self-pay | Admitting: Internal Medicine

## 2024-05-30 ENCOUNTER — Ambulatory Visit (INDEPENDENT_AMBULATORY_CARE_PROVIDER_SITE_OTHER): Admitting: Internal Medicine

## 2024-05-30 ENCOUNTER — Encounter: Payer: Self-pay | Admitting: Internal Medicine

## 2024-05-30 VITALS — BP 130/74 | HR 52 | Ht 72.0 in | Wt 347.0 lb

## 2024-05-30 DIAGNOSIS — Z7985 Long-term (current) use of injectable non-insulin antidiabetic drugs: Secondary | ICD-10-CM | POA: Diagnosis not present

## 2024-05-30 DIAGNOSIS — E1142 Type 2 diabetes mellitus with diabetic polyneuropathy: Secondary | ICD-10-CM | POA: Diagnosis not present

## 2024-05-30 DIAGNOSIS — E1165 Type 2 diabetes mellitus with hyperglycemia: Secondary | ICD-10-CM

## 2024-05-30 DIAGNOSIS — Z7984 Long term (current) use of oral hypoglycemic drugs: Secondary | ICD-10-CM

## 2024-05-30 LAB — POCT GLYCOSYLATED HEMOGLOBIN (HGB A1C): Hemoglobin A1C: 6.9 % — AB (ref 4.0–5.6)

## 2024-05-30 LAB — POCT GLUCOSE (DEVICE FOR HOME USE): Glucose Fasting, POC: 191 mg/dL — AB (ref 70–99)

## 2024-05-30 NOTE — Patient Instructions (Signed)
 Take Glipizide  5 mg, 1 tablet before Breakfast and 1 tablet before Supper Continue Metformin  500 mg XR  ,2  tablets  twice daily  Continue Ozempic  2 mg weekly       HOW TO TREAT LOW BLOOD SUGARS (Blood sugar LESS THAN 70 MG/DL) Please follow the RULE OF 15 for the treatment of hypoglycemia treatment (when your (blood sugars are less than 70 mg/dL)   STEP 1: Take 15 grams of carbohydrates when your blood sugar is low, which includes:  3-4 GLUCOSE TABS  OR 3-4 OZ OF JUICE OR REGULAR SODA OR ONE TUBE OF GLUCOSE GEL    STEP 2: RECHECK blood sugar in 15 MINUTES STEP 3: If your blood sugar is still low at the 15 minute recheck --> then, go back to STEP 1 and treat AGAIN with another 15 grams of carbohydrates.

## 2024-05-30 NOTE — Progress Notes (Unsigned)
 Name: Tony Griffin.  Age/ Sex: 68 y.o., male   MRN/ DOB: [id number], [birth year]     PCP: Dow Ozell PARAS, PA   Reason for Endocrinology Evaluation: Type 2 Diabetes Mellitus  Initial Endocrine Consultative Visit: 07/01/2018    PATIENT IDENTIFIER: Tony Griffin. is a 68 y.o. male with a past medical history of DM, HTN, Hx of prostate cancer . The patient has followed with Endocrinology clinic since 07/01/2018 for consultative assistance with management of his diabetes.  DIABETIC HISTORY:  Tony Griffin was diagnosed with DM 2017, intolerant to Farxiga  due to UTI's . His hemoglobin A1c has ranged from 5.9% in 2020, peaking at 9.8% in 2019.   He was seen by Dr. Kassie from 2019 until 01/2022  On his initial visit with me 07/2022 his A1c 7.6 % , he was on Bromocriptine , Metformin  , Trulicity  and Farxiga . We stopped Bromocriptine , started Glipizide , switched Trulicity  to Ozempic  and continued farxiga  and Metformin     He has had 2 UTI's on Farxiga  10 mg , but stopped 11/2022 due to cost   Due to shortage of supply of Ozempic  he was switched to Trulicity , which was subsequently switched to Mounjaro  02/2023   Due to cost of Mounjaro  and $1160, I switched him back to Ozempic  and was provided patient assistance forms 01/2024  SUBJECTIVE:   During the last visit (01/18/2024): A1c 7.1%   Today (05/30/2024): Tony Griffin is here for a follow up on diabetes management,.  He checks his blood sugars 0 times .   He denies any nausea or vomiting but has noted regurgitation on average once a month that he attributes to Ozempic  He is on PPI for GERD Denies constipation or diarrhea   He follows with urology for  Hx of prostate cancer    HOME DIABETES REGIMEN:  Metformin  500 mg XR  ,2  tabs BID  Glipizide  10 mg, 2 tabs BID Ozempic  2x mg weekly      Statin: yes ACE-I/ARB: yes    METER DOWNLOAD SUMMARY: Did not bring    DIABETIC COMPLICATIONS: Microvascular complications:   Denies: CKD,  neuropathy Last Eye Exam: Completed 2022  Macrovascular complications:   Denies: CAD, CVA, PVD   HISTORY:  Past Medical History:  Past Medical History:  Diagnosis Date   At risk for sleep apnea    STOP--BANG SCORE= 6  (sent to pt's pcp 10-12-2018)   Dyslipidemia    HTN (hypertension)    Hyperplasia of prostate with lower urinary tract symptoms (LUTS)    Prostate cancer [facility]) urologist-- dr bell/  oncologist-- dr patrcia   dx 06-23-2018--- Stage T1c,  Gleason 4+3,  PSA 8.8--- plan IMRT    Type 2 diabetes mellitus [facility])    endocrinologist-- dr kassie   Wears glasses    Past Surgical History:  Past Surgical History:  Procedure Laterality Date   GOLD SEED IMPLANT N/A 10/19/2018   Procedure: GOLD SEED IMPLANT;  Surgeon: Devere Lonni Righter, MD;  Location: [facility];  Service: Urology;  Laterality: N/A;   NO PAST SURGERIES     PROSTATE BIOPSY  06-23-2018   dr bell office   SPACE OAR INSTILLATION N/A 10/19/2018   Procedure: SPACE OAR INSTILLATION;  Surgeon: Devere Lonni Righter, MD;  Location[facility];  Service: Urology;  Laterality: N/A;   Social History:  reports that he has never smoked. He has never used smokeless tobacco. He reports current alcohol use. He reports that he does not use drugs.  Family History:  Family History  Problem Relation Age of Onset   Diabetes Brother    Non-Hodgkin's lymphoma Father    Breast cancer Paternal Aunt    Breast cancer Paternal Aunt    Prostate cancer Neg Hx    Pancreatic cancer Neg Hx    Colon cancer Neg Hx      HOME MEDICATIONS: Allergies as of 05/30/2024       Reactions   Codeine Other (See Comments)   Heart races        Medication List        Accurate as of May 30, 2024  9:38 AM. If you have any questions, ask your nurse or doctor.          STOP taking these medications    Trulicity  3 MG/0.5ML Soaj Generic drug: Dulaglutide  Stopped by: Naiara Lombardozzi J Lilie Vezina        TAKE these medications    glipiZIDE  10 MG tablet Commonly known as: GLUCOTROL  Take 1 tablet (10 mg total) by mouth 2 (two) times daily before a meal.   glucose blood test strip Commonly known as: Contour Test 1 each by Other route daily. And lancets 1/day   lisinopril 10 MG tablet Commonly known as: ZESTRIL Take 10 mg by mouth every evening.   MAGNESIUM PO Take 1 tablet by mouth daily.   metFORMIN  500 MG 24 hr tablet Commonly known as: GLUCOPHAGE -XR Take 4 tablets (2,000 mg total) by mouth daily.   omeprazole 40 MG capsule Commonly known as: PRILOSEC Take 40 mg by mouth every evening.   rosuvastatin 10 MG tablet Commonly known as: CRESTOR Take 10 mg by mouth every evening.   Semaglutide  (1 MG/DOSE) 4 MG/3ML Sopn Inject 1 mg as directed once a week.   Ozempic  (2 MG/DOSE) 8 MG/3ML Sopn Generic drug: Semaglutide  (2 MG/DOSE) Inject 2 mg into the skin once a week.   tamsulosin 0.4 MG Caps capsule Commonly known as: FLOMAX Take 0.4 mg by mouth daily.   VITAMIN D-3 PO Take 1 capsule by mouth daily.         OBJECTIVE:   Vital Signs: BP 130/74 (BP Location: Left Arm, Patient Position: Sitting, Cuff Size: Normal)   Pulse (!) 52   Ht 6' (1.829 m)   Wt (!) 347 lb (157.4 kg)   SpO2 96%   BMI 47.06 kg/m   Wt Readings from Last 3 Encounters:  05/30/24 (!) 347 lb (157.4 kg)  01/18/24 (!) 352 lb (159.7 kg)  08/19/23 (!) 350 lb (158.8 kg)     Exam: General: Pt appears well and is in NAD  Lungs: Clear with good BS bilat   Heart: RRR   Extremities: 1+  pretibial edema.   Neuro: MS is good with appropriate affect, pt is alert and Ox3   DM Foot Exam 01/18/2024 The skin of the feet is without sores or ulcerations, with thickened nails  The pedal pulses are 2+ on right and 2+ on left. The sensation is decreased  to a screening 5.07, 10 gram monofilament bilaterally   DATA REVIEWED:  Lab Results  Component Value Date   HGBA1C 6.9 (A) 05/30/2024   HGBA1C 7.1  (A) 01/18/2024   HGBA1C 7.6 (A) 08/19/2023    In Office BG 191 mg/dL   ASSESSMENT / PLAN / RECOMMENDATIONS:   1) Type 2 Diabetes Mellitus, Sub-OPtimally controlled, With  Neuropathic  complications - Most recent A1c of 6.8%. Goal A1c < 7.0 %.    -A1c is trending down -Patient  has been noted weight gain despite being on higher dose of Mounjaro  -Mounjaro  became cost prohibitive, we have discussed switching to Ozempic , and that way we can apply for patient assistance, he was provided with forms today  -He was unable to tolerate Farxiga  10 mg due to recurrent UTIs -Patient has his PCP through [facility] medical, and they perform his labs, records not available -Will increase glipizide , discussed the importance of taking this 15-20 minutes before the first and last meal of the day  MEDICATIONS: Increase glipizide  10 mg BID Continue metformin  500 mg XR, 2 tabs twice daily Switch Mounjaro  to Ozempic  2 mg weekly   EDUCATION / INSTRUCTIONS: BG monitoring instructions: Patient is instructed to check his blood sugars 1 times a week. Call [facility] if: BG persistently < 70  I reviewed the Rule of 15 for the treatment of hypoglycemia in detail with the patient. Literature supplied.    2) Diabetic complications:  Eye: Does not have known diabetic retinopathy.  Neuro/ Feet: Does  have known diabetic peripheral neuropathy .  Renal: Patient does not have known baseline CKD. He   is on an ACEI/ARB at present.     F/U in 4 months   Signed electronically by: Stefano Redgie Butts, MD  [facility]  [facility] [address] [place] [place], [place] [address] Phone: [phone] FAX: [phone]   CC: Dow Ozell PARAS, [place] [address] [place] [address] Phone: [phone]  Fax: [phone]  Return to Endocrinology clinic as below: No future appointments.

## 2024-07-24 ENCOUNTER — Other Ambulatory Visit: Payer: Self-pay

## 2024-07-24 ENCOUNTER — Encounter: Payer: Self-pay | Admitting: Internal Medicine

## 2024-07-24 MED ORDER — OZEMPIC (2 MG/DOSE) 8 MG/3ML ~~LOC~~ SOPN: 2.0000 mg | PEN_INJECTOR | SUBCUTANEOUS | 3 refills | Status: DC

## 2024-08-12 ENCOUNTER — Emergency Department (HOSPITAL_COMMUNITY)
Admission: EM | Admit: 2024-08-12 | Discharge: 2024-08-12 | Disposition: A | Discharge status: Home / Self Care | Source: Ambulatory Visit | Attending: Emergency Medicine | Admitting: Emergency Medicine

## 2024-08-12 ENCOUNTER — Emergency Department (HOSPITAL_COMMUNITY)

## 2024-08-12 ENCOUNTER — Other Ambulatory Visit: Payer: Self-pay

## 2024-08-12 ENCOUNTER — Encounter (HOSPITAL_COMMUNITY): Payer: Self-pay | Admitting: *Deleted

## 2024-08-12 DIAGNOSIS — R059 Cough, unspecified: Secondary | ICD-10-CM | POA: Insufficient documentation

## 2024-08-12 DIAGNOSIS — I10 Essential (primary) hypertension: Secondary | ICD-10-CM | POA: Diagnosis not present

## 2024-08-12 DIAGNOSIS — Z7984 Long term (current) use of oral hypoglycemic drugs: Secondary | ICD-10-CM | POA: Diagnosis not present

## 2024-08-12 DIAGNOSIS — R0789 Other chest pain: Secondary | ICD-10-CM | POA: Diagnosis not present

## 2024-08-12 DIAGNOSIS — R079 Chest pain, unspecified: Secondary | ICD-10-CM | POA: Diagnosis present

## 2024-08-12 DIAGNOSIS — R7989 Other specified abnormal findings of blood chemistry: Secondary | ICD-10-CM | POA: Insufficient documentation

## 2024-08-12 DIAGNOSIS — Z79899 Other long term (current) drug therapy: Secondary | ICD-10-CM | POA: Diagnosis not present

## 2024-08-12 DIAGNOSIS — R0601 Orthopnea: Secondary | ICD-10-CM | POA: Diagnosis not present

## 2024-08-12 DIAGNOSIS — E119 Type 2 diabetes mellitus without complications: Secondary | ICD-10-CM | POA: Insufficient documentation

## 2024-08-12 DIAGNOSIS — Z8546 Personal history of malignant neoplasm of prostate: Secondary | ICD-10-CM | POA: Diagnosis not present

## 2024-08-12 LAB — BASIC METABOLIC PANEL WITH GFR
Anion gap: 9 (ref 5–15)
BUN: 14 mg/dL (ref 8–23)
CO2: 25 mmol/L (ref 22–32)
Calcium: 8.6 mg/dL — ABNORMAL LOW (ref 8.9–10.3)
Chloride: 105 mmol/L (ref 98–111)
Creatinine, Ser: 1.02 mg/dL (ref 0.61–1.24)
GFR, Estimated: >60 mL/min (ref >60)
Glucose, Bld: 81 mg/dL (ref 70–99)
Potassium: 4.3 mmol/L (ref 3.5–5.1)
Sodium: 139 mmol/L (ref 135–145)

## 2024-08-12 LAB — CBC
HCT: 44.6 % (ref 39.0–52.0)
Hemoglobin: 14.6 g/dL (ref 13.0–17.0)
MCH: 29 pg (ref 26.0–34.0)
MCHC: 32.7 g/dL (ref 30.0–36.0)
MCV: 88.7 fL (ref 80.0–100.0)
Platelets: 161 K/uL (ref 150–400)
RBC: 5.03 MIL/uL (ref 4.22–5.81)
RDW: 13.7 % (ref 11.5–15.5)
WBC: 10 K/uL (ref 4.0–10.5)
nRBC: 0 % (ref 0.0–0.2)

## 2024-08-12 LAB — TROPONIN I (HIGH SENSITIVITY)
Troponin I (High Sensitivity): 17 ng/L (ref <18)
Troponin I (High Sensitivity): 17 ng/L (ref <18)

## 2024-08-12 LAB — BRAIN NATRIURETIC PEPTIDE: B Natriuretic Peptide: 490.5 pg/mL — ABNORMAL HIGH (ref 0.0–100.0)

## 2024-08-12 MED ORDER — FUROSEMIDE 20 MG PO TABS: 20.0000 mg | ORAL_TABLET | Freq: Every day | ORAL | 0 refills | Status: DC

## 2024-08-12 MED ORDER — FUROSEMIDE 10 MG/ML IJ SOLN: 20.0000 mg | Freq: Once | INTRAMUSCULAR | Status: DC

## 2024-08-12 MED ORDER — FUROSEMIDE 20 MG PO TABS
20.0000 mg | ORAL_TABLET | Freq: Once | ORAL | Status: AC
  Administered 2024-08-12: 20 mg via ORAL
  Filled 2024-08-12: qty 1

## 2024-08-12 NOTE — ED Provider Notes (Signed)
 I provided a substantive portion of the care of this patient.  I personally made/approved the management plan for this patient and take responsibility for the patient management.  EKG Interpretation Date/Time:  Saturday August 12 2024 15:21:35 EDT Ventricular Rate:  94 PR Interval:  132 QRS Duration:  144 QT Interval:  414 QTC Calculation: 517 R Axis:   74  Text Interpretation: Normal sinus rhythm with sinus arrhythmia Left bundle branch block Abnormal ECG When compared with ECG of 19-Oct-2018 09:11, PREVIOUS ECG IS PRESENT old LBBB with repolarization abnormality. Confirmed by Armenta Canning [phone]) on 08/12/2024 3:25:36 PM   Patient reports has got some increased swelling of the legs and shortness of breath with lying flat.  Patient was seen by PCP and started on Lasix 20 mg daily.  He reports that he still feeling like he is fatigued and gets winded with minor activity.  Alert with clear mental status.  Heart regular no appreciable rub murmur gallop.  Lungs are clear to auscultation I do not appreciate any crackle or wheeze.  Significant central abdominal obesity soft and nontender without edema of the abdominal wall.  2+ soft pitting edema bilateral lower extremities symmetric.  Skin changes consistent with chronic venous stasis with hyperpigmentation and loss of hair.  No wounds.  Neurologically patient is alert with clear mental status.  Normal cognitive function normal speech normal symmetric movements.  The symptoms have been incremental since a diagnosis of pneumonia several months ago with 2 rounds of antibiotics and steroids.  Clinically patient shows no signs of infectious illness.  He does have mild elevation in BNP and peripheral edema.  I suspect a very mild CHF.  Also we discussed contributor factor of sleep apnea which patient's wife confirms that he has and obesity hypoventilation.  These factors may be multifactorial.  My recommendation at this time is to increase the Lasix to 40  mg daily in the morning for the next 3 to 4 days, follow-up with ambulatory referral to cardiology and careful return precautions reviewed.   Armenta Canning, MD 08/12/24 [phone]

## 2024-08-12 NOTE — Discharge Instructions (Signed)
 You were seen in the ER today for concerns of chest tightness and trouble breathing. You were found to have an elevated BNP level which indicates likely issues with developing congestive heart failure. You should increase your Lasix dose from 20mg  once daily to 40mg  once daily. Please take this as prescribed. For any concerns of new or worsening symptoms, return to the ER. I have placed a referral to cardiology for you to have follow up with their clinic.

## 2024-08-12 NOTE — ED Triage Notes (Signed)
 Chest tightness  and some sob for one month  he was seen at [facility] and they sent him here

## 2024-08-12 NOTE — ED Notes (Signed)
 Pt ambulated to the bathroom.

## 2024-08-12 NOTE — ED Provider Notes (Signed)
 [place] EMERGENCY DEPARTMENT AT [facility] Provider Note   CSN: [id number] Arrival date & time: 08/12/24  1509     Patient presents with: Chest Pain   Tony Griffin. is a 68 y.o. male.  Patient has history significant for type 2 diabetes, prostate cancer, hypertension here with concerns of chest pain.  Reports that he was seen at walk-in clinic and past medical advice given for evaluation of chest tightness and shortness of breath ongoing for the last month.  He reportedly takes Lasix 20 mg daily but even through this, states that he has had worsening orthopnea, exertional dyspnea, and leg swelling.  Reportedly had an 8 pound weight gain in the last week.  Denies any fever, congestion, and endorses nonproductive cough.    Chest Pain Associated symptoms: shortness of breath        Prior to Admission medications   Medication Sig Start Date End Date Taking? Authorizing Provider  furosemide (LASIX) 20 MG tablet Take 1 tablet (20 mg total) by mouth daily. 08/12/24 09/11/24 Yes Dejanee Thibeaux A, PA-C  Cholecalciferol (VITAMIN D-3 PO) Take 1 capsule by mouth daily.    [provider]  glipiZIDE  (GLUCOTROL ) 10 MG tablet Take 1 tablet (10 mg total) by mouth 2 (two) times daily before a meal. 01/18/24   Shamleffer, Donell Cardinal, MD  glucose blood (CONTOUR TEST) test strip 1 each by Other route daily. And lancets 1/day 07/13/18   Kassie Mallick, MD  lisinopril (PRINIVIL,ZESTRIL) 10 MG tablet Take 10 mg by mouth every evening.  06/25/18   [provider]  MAGNESIUM PO Take 1 tablet by mouth daily.    [provider]  metFORMIN  (GLUCOPHAGE -XR) 500 MG 24 hr tablet Take 4 tablets (2,000 mg total) by mouth daily. 01/18/24   Shamleffer, Ibtehal Jaralla, MD  omeprazole (PRILOSEC) 40 MG capsule Take 40 mg by mouth every evening.  08/08/17   [provider]  OZEMPIC , 2 MG/DOSE, 8 MG/3ML SOPN Inject 2 mg into the skin once a week. 07/24/24   Shamleffer, Ibtehal  Jaralla, MD  rosuvastatin (CRESTOR) 10 MG tablet Take 10 mg by mouth every evening.  10/03/17   [provider]  tamsulosin (FLOMAX) 0.4 MG CAPS capsule Take 0.4 mg by mouth daily. 09/27/20   [provider]    Allergies: Codeine    Review of Systems  Respiratory:  Positive for shortness of breath.   Cardiovascular:  Positive for chest pain.  All other systems reviewed and are negative.   Updated Vital Signs BP 111/83   Pulse 85   Temp (!) 96.6 F (35.9 C) (Axillary)   Resp 19   Ht 6' (1.829 m)   Wt (!) 157.4 kg   SpO2 98%   BMI 47.06 kg/m   Physical Exam Vitals and nursing note reviewed.  Constitutional:      General: He is not in acute distress.    Appearance: He is well-developed.  HENT:     Head: Normocephalic and atraumatic.  Eyes:     Conjunctiva/sclera: Conjunctivae normal.  Cardiovascular:     Rate and Rhythm: Normal rate and regular rhythm.     Heart sounds: No murmur heard. Pulmonary:     Effort: Pulmonary effort is normal. No respiratory distress.     Breath sounds: Normal breath sounds. No decreased breath sounds, wheezing or rhonchi.  Abdominal:     General: Bowel sounds are normal.     Palpations: Abdomen is soft. There is no fluid wave.  Tenderness: There is no abdominal tenderness.  Musculoskeletal:        General: No swelling.     Cervical back: Neck supple.     Right lower leg: Tenderness present.     Left lower leg: Tenderness present.     Comments: 2+ pitting edema to bilateral lower extremities.  Skin:    General: Skin is warm and dry.     Capillary Refill: Capillary refill takes less than 2 seconds.  Neurological:     Mental Status: He is alert.  Psychiatric:        Mood and Affect: Mood normal.     (all labs ordered are listed, but only abnormal results are displayed) Labs Reviewed  BASIC METABOLIC PANEL WITH GFR - Abnormal; Notable for the following components:      Result Value   Calcium 8.6 (*)    All  other components within normal limits  BRAIN NATRIURETIC PEPTIDE - Abnormal; Notable for the following components:   B Natriuretic Peptide 490.5 (*)    All other components within normal limits  CBC  TROPONIN I (HIGH SENSITIVITY)  TROPONIN I (HIGH SENSITIVITY)    EKG: EKG Interpretation Date/Time:  Saturday August 12 2024 15:21:35 EDT Ventricular Rate:  94 PR Interval:  132 QRS Duration:  144 QT Interval:  414 QTC Calculation: 517 R Axis:   74  Text Interpretation: Normal sinus rhythm with sinus arrhythmia Left bundle branch block Abnormal ECG When compared with ECG of 19-Oct-2018 09:11, PREVIOUS ECG IS PRESENT old LBBB with repolarization abnormality. Confirmed by Armenta Canning [phone]) on 08/12/2024 3:25:36 PM  Radiology: DG Chest 2 View Result Date: 08/12/2024 CLINICAL DATA:  Shortness of breath and chest tightness. EXAM: CHEST - 2 VIEW COMPARISON:  None Available. FINDINGS: Trachea is midline. Heart is at the upper limits of normal in size to mildly enlarged. Lungs are clear. No pleural fluid. Degenerative changes in the spine. IMPRESSION: No acute findings. Electronically Signed   By: Newell Eke M.D.   On: 08/12/2024 16:45     Procedures   Medications Ordered in the ED  furosemide (LASIX) tablet 20 mg (20 mg Oral Given 08/12/24 2000)                                    Medical Decision Making Amount and/or Complexity of Data Reviewed Labs: ordered.  Risk Prescription drug management.   This patient presents to the ED for concern of chest tightness, shortness of breath, this involves an extensive number of treatment options, and is a complaint that carries with it a high risk of complications and morbidity.  The differential diagnosis includes ACS, PE, CHF, pneumonia   Co morbidities that complicate the patient evaluation  Hypertension, type 2 diabetes, prostate cancer   Lab Tests:  I Ordered, and personally interpreted labs.  The pertinent results  include: CBC unremarkable, BMP unremarkable, initial troponin negative at 17, repeat troponin flat at 17, BNP elevated at 490.5   Imaging Studies ordered:  I ordered imaging studies including chest x-ray I independently visualized and interpreted imaging which showed negative for any acute cardiopulmonary process I agree with the radiologist interpretation   Cardiac Monitoring: / EKG:  The patient was maintained on a cardiac monitor.  I personally viewed and interpreted the cardiac monitored which showed an underlying rhythm of: sinus rhythm with left bundle branch block   Consultations Obtained:  I requested consultation with  none,  and discussed lab and imaging findings as well as pertinent plan - they recommend: N/A   Problem List / ED Course / Critical interventions / Medication management  Patient with past history significant for type 2 diabetes, prostate cancer, hypertension presents to the emergency department with concerns of chest pain.  Reports that he been having chest tightness and shortness of breath ongoing for the last month.  Was seen at a walk-in clinic with [facility] medical and advised coming emergency department for evaluation given concerns of possible CHF.  No prior history of CHF.  Does report taking Lasix 20 mg daily for fluid retention.  He was noted to have an 8 pound weight gain in the last week based on records from [place] [facility].  He endorses exertional dyspnea and orthopnea.  This is new in nature for him.  He denies any significant feelings of chest pain at this time only feels some slight tightness. Physical exam reveals 2+ pitting edema in bilateral lower extremities.  Normal heart and lung sounds.  Abdomen is soft and nontender. Troponin is negative x 2.  BNP elevated at 490.5.  This is an indeterminate area regarding possible CHF picture although in the setting of his orthopnea and dyspnea, I suspect this may be the case.  In patient setting of orthopnea  and exertional dyspnea, suspect likely new onset CHF although patient will require formal testing and evaluation for this.  He is not currently hypoxic and not requiring new O2 demand.  With patient's findings, I feel that he is stable for outpatient cardiology follow-up and referral to cardiology has been placed.  Will discharge home with instructions to increase his Lasix from 20mg  to 40 mg for the next 4 days before returning back to normal dose. Return precautions discussed such as concerns for new or worsening symptoms.  Discharged home in stable condition. I ordered medication including Lasix for fluid overload Reevaluation of the patient after these medicines showed that the patient improved I have reviewed the patients home medicines and have made adjustments as needed   Social Determinants of Health:  None   Test / Admission - Considered:  Admission considered but stable for outpatient follow-up.  Final diagnoses:  Other chest pain  Elevated brain natriuretic peptide (BNP) level  Orthopnea    ED Discharge Orders          Ordered    Ambulatory referral to Cardiology       Comments: If you have not heard from the Cardiology office within the next 72 hours please call [phone].   08/12/24 1957    furosemide (LASIX) 20 MG tablet  Daily        08/12/24 1957               Doreene Forrey A, PA-C 08/12/24 2225    Armenta Canning, MD 08/21/24 1157

## 2024-08-16 ENCOUNTER — Encounter: Payer: Self-pay | Admitting: Cardiovascular Disease

## 2024-08-16 ENCOUNTER — Ambulatory Visit: Attending: Cardiology | Admitting: Cardiovascular Disease

## 2024-08-16 VITALS — BP 124/79 | HR 86 | Resp 16 | Ht 72.0 in | Wt 340.0 lb

## 2024-08-16 DIAGNOSIS — I5089 Other heart failure: Secondary | ICD-10-CM | POA: Diagnosis present

## 2024-08-16 DIAGNOSIS — R7989 Other specified abnormal findings of blood chemistry: Secondary | ICD-10-CM | POA: Insufficient documentation

## 2024-08-16 DIAGNOSIS — I10 Essential (primary) hypertension: Secondary | ICD-10-CM | POA: Diagnosis not present

## 2024-08-16 DIAGNOSIS — E785 Hyperlipidemia, unspecified: Secondary | ICD-10-CM | POA: Diagnosis not present

## 2024-08-16 DIAGNOSIS — E8770 Fluid overload, unspecified: Secondary | ICD-10-CM | POA: Insufficient documentation

## 2024-08-16 DIAGNOSIS — G4733 Obstructive sleep apnea (adult) (pediatric): Secondary | ICD-10-CM | POA: Insufficient documentation

## 2024-08-16 MED ORDER — FUROSEMIDE 40 MG PO TABS: ORAL_TABLET | ORAL | 3 refills | Status: DC

## 2024-08-16 MED ORDER — POTASSIUM CHLORIDE CRYS ER 20 MEQ PO TBCR: 20.0000 meq | EXTENDED_RELEASE_TABLET | Freq: Every day | ORAL | 3 refills | Status: DC

## 2024-08-16 NOTE — Patient Instructions (Addendum)
 Medication Instructions:  Increase Furosemide to 40 mg Twice a day for one week, then decrease back to 40 mg daily Start Potassium 20 meq daily *If you need a refill on your cardiac medications before your next appointment, please call your pharmacy*  Lab Work: BMP, Lipid panel- 2 weeks- Fasting If you have labs (blood work) drawn today and your tests are completely normal, you will receive your results only by: MyChart Message (if you have MyChart) OR A paper copy in the mail If you have any lab test that is abnormal or we need to change your treatment, we will call you to review the results.  Testing/Procedures: Your physician has requested that you have an echocardiogram. Echocardiography is a painless test that uses sound waves to create images of your heart. It provides your doctor with information about the size and shape of your heart and how well your heart's chambers and valves are working. This procedure takes approximately one hour. There are no restrictions for this procedure. Please do NOT wear cologne, perfume, aftershave, or lotions (deodorant is allowed). Please arrive 15 minutes prior to your appointment time.  Please note: We ask at that you not bring children with you during ultrasound (echo/ vascular) testing. Due to room size and safety concerns, children are not allowed in the ultrasound rooms during exams. Our front office staff cannot provide observation of children in our lobby area while testing is being conducted. An adult accompanying a patient to their appointment will only be allowed in the ultrasound room at the discretion of the ultrasound technician under special circumstances. We apologize for any inconvenience.   Dr Court has ordered a CT coronary calcium score.   Test locations:  [facility] at [facility]  [facility] at [facility]  This is $99 out of  pocket.   Coronary CalciumScan A coronary calcium scan is an imaging test used to look for deposits of calcium and other fatty materials (plaques) in the inner lining of the blood vessels of the heart (coronary arteries). These deposits of calcium and plaques can partly clog and narrow the coronary arteries without producing any symptoms or warning signs. This puts a person at risk for a heart attack. This test can detect these deposits before symptoms develop. Tell a health care provider about: Any allergies you have. All medicines you are taking, including vitamins, herbs, eye drops, creams, and over-the-counter medicines. Any problems you or family members have had with anesthetic medicines. Any blood disorders you have. Any surgeries you have had. Any medical conditions you have. Whether you are pregnant or may be pregnant. What are the risks? Generally, this is a safe procedure. However, problems may occur, including: Harm to a pregnant woman and her unborn baby. This test involves the use of radiation. Radiation exposure can be dangerous to a pregnant woman and her unborn baby. If you are pregnant, you generally should not have this procedure done. Slight increase in the risk of cancer. This is because of the radiation involved in the test. What happens before the procedure? No preparation is needed for this procedure. What happens during the procedure? You will undress and remove any jewelry around your neck or chest. You will put on a hospital gown. Sticky electrodes will be placed on your chest. The electrodes will be connected to an electrocardiogram (ECG) machine to record a tracing of the electrical activity of your heart. A CT  scanner will take pictures of your heart. During this time, you will be asked to lie still and hold your breath for 2-3 seconds while a picture of your heart is being taken. The procedure may vary among health care providers and hospitals. What happens  after the procedure? You can get dressed. You can return to your normal activities. It is up to you to get the results of your test. Ask your health care provider, or the department that is doing the test, when your results will be ready. Summary A coronary calcium scan is an imaging test used to look for deposits of calcium and other fatty materials (plaques) in the inner lining of the blood vessels of the heart (coronary arteries). Generally, this is a safe procedure. Tell your health care provider if you are pregnant or may be pregnant. No preparation is needed for this procedure. A CT scanner will take pictures of your heart. You can return to your normal activities after the scan is done. This information is not intended to replace advice given to you by your health care provider. Make sure you discuss any questions you have with your health care provider. Document Released: 04/23/2008 Document Revised: 09/14/2016 Document Reviewed: 09/14/2016 Elsevier Interactive Patient Education  2017 [employer].      Follow-Up: At [facility], you and your health needs are our priority.  As part of our continuing mission to provide you with exceptional heart care, our providers are all part of one team.  This team includes your primary Cardiologist (physician) and Advanced Practice Providers or APPs (Physician Assistants and Nurse Practitioners) who all work together to provide you with the care you need, when you need it.  Your next appointment:   APP 4-6 weeks  Dr Court 3 months   We recommend signing up for the patient portal called MyChart.  Sign up information is provided on this After Visit Summary.  MyChart is used to connect with patients for Virtual Visits (Telemedicine).  Patients are able to view lab/test results, encounter notes, upcoming appointments, etc.  Non-urgent messages can be sent to your provider as well.   To learn more about what you can do with MyChart, go to  [email or web address].

## 2024-08-16 NOTE — Addendum Note (Signed)
 Addended by: LORRENE FEDERICO CROME on: 08/16/2024 04:00 PM   Modules accepted: Orders

## 2024-08-16 NOTE — Progress Notes (Signed)
 08/16/2024 Tony Griffin.   1956/01/24  [id number]  Primary Physician Center, [facility] Primary Cardiologist: Dorn JINNY Lesches MD [facility], [facility] [facility], [place]  HPI:  Tony Griffin. is a 68 y.o. morbidly overweight married Caucasian male father 1 child is accompanied by his wife Tony Griffin today.  He was referred by the emergency room where he was 10//25 with shortness of breath.  He currently is retired and was a Tax adviser.  His risk factors include treated hypertension, diabetes and hyperlipidemia.  There is no family history of heart disease.  Never had a heart attack or stroke.  He denies chest pain.  He is experienced increased dyspnea on exertion and orthopnea for 1 month.  He did present to the ER on 08/12/2024 with the symptoms.  Chest x-ray was clear.  His BNP was measured at 490.  His furosemide was doubled and he was discharged home with early cardiology follow-up.  Since being on this increased dose of Lasix he has felt mildly improved.   No outpatient medications have been marked as taking for the 08/16/24 encounter (Office Visit) with Lesches Dorn JINNY, MD.     Allergies  Allergen Reactions   Codeine Other (See Comments)    Heart races    Social History   Socioeconomic History   Marital status: Married    Spouse name: Tony Griffin   Number of children: 1   Years of education: Not on file   Highest education level: Not on file  Occupational History   Not on file  Tobacco Use   Smoking status: Never   Smokeless tobacco: Never  Vaping Use   Vaping status: Never Used  Substance and Sexual Activity   Alcohol use: Yes    Comment: occasional   Drug use: Never   Sexual activity: Yes    Partners: Female    Comment: vasectomy-- 2000  Other Topics Concern   Not on file  Social History Narrative   Not on file   Social Drivers of Health   Financial Resource Strain: Not on file  Food Insecurity: Not on file  Transportation Needs: Not on file  Physical Activity: Not on file   Stress: Not on file  Social Connections: Not on file  Intimate Partner Violence: Not on file     Review of Systems: General: negative for chills, fever, night sweats or weight changes.  Cardiovascular: negative for chest pain, dyspnea on exertion, edema, orthopnea, palpitations, paroxysmal nocturnal dyspnea or shortness of breath Dermatological: negative for rash Respiratory: negative for cough or wheezing Urologic: negative for hematuria Abdominal: negative for nausea, vomiting, diarrhea, bright red blood per rectum, melena, or hematemesis Neurologic: negative for visual changes, syncope, or dizziness All other systems reviewed and are otherwise negative except as noted above.    Blood pressure 124/79, pulse 86, resp. rate 16, height 6' (1.829 m), weight (!) 340 lb (154.2 kg), SpO2 97%.  General appearance: alert and no distress Neck: no adenopathy, no carotid bruit, no JVD, supple, symmetrical, trachea midline, and thyroid not enlarged, symmetric, no tenderness/mass/nodules Lungs: clear to auscultation bilaterally Heart: regular rate and rhythm, S1, S2 normal, no murmur, click, rub or gallop Extremities: 2-3+ pitting edema bilaterally Pulses: 2+ and symmetric Skin: Skin color, texture, turgor normal. No rashes or lesions Neurologic: Grossly normal  EKG not performed today      ASSESSMENT AND PLAN:   HTN (hypertension) History of essential hypertension with blood pressure measured today at 124/79.  He is on lisinopril.  Dyslipidemia History of dyslipidemia on statin therapy.  Will recheck a lipid liver profile.  Volume overload Patient has been experiencing increasing dyspnea exertion and orthopnea for a month.  He was recently seen in the ER 08/12/24.  His BNP was elevated at 490.  Chest x-ray was clear.  His furosemide was doubled.  He has somewhat diuresed and feels clinically improved.  I am going to double his furosemide to 40 mg p.o. twice daily for a week and then down  to 40 mg a day.  Will check a basic metabolic panel in 2 weeks and he will see an APP back in 4 to 6 weeks.  I am also going to obtain a 2D echocardiogram and a coronary calcium score.  Obstructive sleep apnea Patient has history of sleep apnea, nighttime snoring and daytime somnolence with morbid obesity.  I am going to refer him to pulmonary for workup of obstructive sleep apnea.     Dorn DOROTHA Lesches MD FACP,FACC,FAHA, [facility] 08/16/2024 2:58 PM

## 2024-08-16 NOTE — Assessment & Plan Note (Signed)
 Patient has been experiencing increasing dyspnea exertion and orthopnea for a month.  He was recently seen in the ER 08/12/24.  His BNP was elevated at 490.  Chest x-ray was clear.  His furosemide was doubled.  He has somewhat diuresed and feels clinically improved.  I am going to double his furosemide to 40 mg p.o. twice daily for a week and then down to 40 mg a day.  Will check a basic metabolic panel in 2 weeks and he will see an APP back in 4 to 6 weeks.  I am also going to obtain a 2D echocardiogram and a coronary calcium score.

## 2024-08-16 NOTE — Assessment & Plan Note (Signed)
 History of dyslipidemia on statin therapy.  Will recheck a lipid liver profile.

## 2024-08-16 NOTE — Assessment & Plan Note (Signed)
 History of essential hypertension with blood pressure measured today at 124/79.  He is on lisinopril.

## 2024-08-16 NOTE — Assessment & Plan Note (Signed)
 Patient has history of sleep apnea, nighttime snoring and daytime somnolence with morbid obesity.  I am going to refer him to pulmonary for workup of obstructive sleep apnea.

## 2024-08-28 LAB — OPHTHALMOLOGY REPORT-SCANNED

## 2024-08-31 ENCOUNTER — Encounter: Payer: Self-pay | Admitting: Cardiovascular Disease

## 2024-09-01 LAB — LIPID PANEL
Chol/HDL Ratio: 4.1 ratio (ref 0.0–5.0)
Cholesterol, Total: 119 mg/dL (ref 100–199)
HDL: 29 mg/dL — ABNORMAL LOW (ref >39)
LDL Chol Calc (NIH): 64 mg/dL (ref 0–99)
Triglycerides: 152 mg/dL — ABNORMAL HIGH (ref 0–149)
VLDL Cholesterol Cal: 26 mg/dL (ref 5–40)

## 2024-09-01 LAB — BASIC METABOLIC PANEL WITH GFR
BUN/Creatinine Ratio: 14 (ref 10–24)
BUN: 12 mg/dL (ref 8–27)
CO2: 23 mmol/L (ref 20–29)
Calcium: 9.3 mg/dL (ref 8.6–10.2)
Chloride: 98 mmol/L (ref 96–106)
Creatinine, Ser: 0.84 mg/dL (ref 0.76–1.27)
Glucose: 144 mg/dL — ABNORMAL HIGH (ref 70–99)
Potassium: 4.4 mmol/L (ref 3.5–5.2)
Sodium: 139 mmol/L (ref 134–144)
eGFR: 95 mL/min/1.73 (ref >59)

## 2024-09-02 ENCOUNTER — Ambulatory Visit: Payer: Self-pay | Admitting: Cardiovascular Disease

## 2024-09-13 ENCOUNTER — Ambulatory Visit (HOSPITAL_COMMUNITY)
Admission: RE | Admit: 2024-09-13 | Discharge: 2024-09-13 | Disposition: A | Discharge status: Home / Self Care | Payer: Self-pay | Source: Ambulatory Visit | Attending: Cardiology | Admitting: Cardiology

## 2024-09-13 ENCOUNTER — Other Ambulatory Visit (HOSPITAL_COMMUNITY)

## 2024-09-13 ENCOUNTER — Encounter (HOSPITAL_COMMUNITY): Payer: Self-pay | Admitting: *Deleted

## 2024-09-13 ENCOUNTER — Ambulatory Visit (HOSPITAL_COMMUNITY)
Admission: RE | Admit: 2024-09-13 | Discharge: 2024-09-13 | Disposition: A | Discharge status: Home / Self Care | Source: Ambulatory Visit | Attending: Cardiology | Admitting: Cardiology

## 2024-09-13 DIAGNOSIS — R7989 Other specified abnormal findings of blood chemistry: Secondary | ICD-10-CM | POA: Insufficient documentation

## 2024-09-13 DIAGNOSIS — I5089 Other heart failure: Secondary | ICD-10-CM

## 2024-09-13 DIAGNOSIS — I10 Essential (primary) hypertension: Secondary | ICD-10-CM

## 2024-09-13 DIAGNOSIS — G4733 Obstructive sleep apnea (adult) (pediatric): Secondary | ICD-10-CM

## 2024-09-13 DIAGNOSIS — E8770 Fluid overload, unspecified: Secondary | ICD-10-CM | POA: Diagnosis not present

## 2024-09-13 DIAGNOSIS — E785 Hyperlipidemia, unspecified: Secondary | ICD-10-CM

## 2024-09-13 DIAGNOSIS — R0600 Dyspnea, unspecified: Secondary | ICD-10-CM | POA: Diagnosis not present

## 2024-09-13 LAB — ECHOCARDIOGRAM COMPLETE
Area-P 1/2: 5.06 cm2
S' Lateral: 5.4 cm

## 2024-09-13 MED ORDER — PERFLUTREN LIPID MICROSPHERE
1.0000 mL | INTRAVENOUS | Status: DC | PRN
  Administered 2024-09-13: 1 mL via INTRAVENOUS

## 2024-09-13 NOTE — Progress Notes (Signed)
 Notified echocardiogram reader, Dr. Ren Ny concerning echocardiogram findings. He reviewed images. Patient was discharged per Dr. Ren Tonleu nonsymptomatic and oriented.

## 2024-09-25 ENCOUNTER — Ambulatory Visit: Attending: Cardiovascular Disease | Admitting: Cardiovascular Disease

## 2024-09-25 ENCOUNTER — Other Ambulatory Visit: Payer: Self-pay | Admitting: Cardiovascular Disease

## 2024-09-25 ENCOUNTER — Encounter: Payer: Self-pay | Admitting: Cardiovascular Disease

## 2024-09-25 VITALS — BP 112/70 | HR 94 | Ht 72.0 in | Wt 340.0 lb

## 2024-09-25 DIAGNOSIS — I519 Heart disease, unspecified: Secondary | ICD-10-CM

## 2024-09-25 DIAGNOSIS — I10 Essential (primary) hypertension: Secondary | ICD-10-CM | POA: Insufficient documentation

## 2024-09-25 DIAGNOSIS — G4733 Obstructive sleep apnea (adult) (pediatric): Secondary | ICD-10-CM | POA: Diagnosis not present

## 2024-09-25 DIAGNOSIS — E785 Hyperlipidemia, unspecified: Secondary | ICD-10-CM | POA: Insufficient documentation

## 2024-09-25 DIAGNOSIS — I7121 Aneurysm of the ascending aorta, without rupture: Secondary | ICD-10-CM | POA: Diagnosis present

## 2024-09-25 DIAGNOSIS — I712 Thoracic aortic aneurysm, without rupture, unspecified: Secondary | ICD-10-CM | POA: Insufficient documentation

## 2024-09-25 MED ORDER — FUROSEMIDE 40 MG PO TABS: 40.0000 mg | ORAL_TABLET | Freq: Two times a day (BID) | ORAL | 3 refills | Status: AC

## 2024-09-25 MED ORDER — ROSUVASTATIN CALCIUM 10 MG PO TABS: 10.0000 mg | ORAL_TABLET | Freq: Every evening | ORAL | 3 refills | Status: DC

## 2024-09-25 MED ORDER — CARVEDILOL 3.125 MG PO TABS: 3.1250 mg | ORAL_TABLET | Freq: Two times a day (BID) | ORAL | 3 refills | Status: AC

## 2024-09-25 NOTE — Assessment & Plan Note (Signed)
 History of essential hypertension with blood pressure measured today at 112/70.  He is on lisinopril.

## 2024-09-25 NOTE — Assessment & Plan Note (Signed)
 History of dyslipidemia on rosuvastatin with lipid profile performed 08/28/2024 revealing total cholesterol 119, LDL 64 and HDL 29.

## 2024-09-25 NOTE — Addendum Note (Signed)
 Addended by: LORRENE FEDERICO CROME on: 09/25/2024 12:27 PM   Modules accepted: Orders

## 2024-09-25 NOTE — Progress Notes (Signed)
 09/25/2024 Tony Griffin.   04-02-1956  [id number]  [facility], [facility] Primary Cardiologist: Dorn JINNY Lesches MD [facility], [place], [place]  HPI:  Tony Griffin. is a 68 y.o.  morbidly overweight married Caucasian male father 1 child is accompanied by his wife Tony Griffin today.  I last saw him in the office 08/16/2024.  He was referred by the emergency room where he was 08/12/24 with shortness of breath. He currently is retired and was a tax adviser. His risk factors include treated hypertension, diabetes and hyperlipidemia. There is no family history of heart disease. Never had a heart attack or stroke. He denies chest pain. He is experienced increased dyspnea on exertion and orthopnea for 1 month. He did present to the ER on 08/12/2024 with the symptoms. Chest x-ray was clear. His BNP was measured at 490. His furosemide was doubled and he was discharged home with early cardiology follow-up. Since being on this increased dose of Lasix he has felt mildly improved.  Since I saw him a little over a month ago I did get a 2D echocardiogram on him 09/13/2024 revealing an EF of 25 to 30% with mildly dilated LV and grade 1 diastolic dysfunction.  A coronary calcium score was performed at the same day which was 263 the majority of which was in the LAD and circumflex although he denies chest pain.   Current Meds  Medication Sig   carvedilol (COREG) 3.125 MG tablet Take 1 tablet (3.125 mg total) by mouth 2 (two) times daily.   Cholecalciferol (VITAMIN D-3 PO) Take 1 capsule by mouth daily.   glipiZIDE  (GLUCOTROL ) 10 MG tablet Take 1 tablet (10 mg total) by mouth 2 (two) times daily before a meal.   lisinopril (PRINIVIL,ZESTRIL) 10 MG tablet Take 10 mg by mouth every evening.    MAGNESIUM PO Take 1 tablet by mouth daily.   metFORMIN  (GLUCOPHAGE -XR) 500 MG 24 hr tablet Take 4 tablets (2,000 mg total) by mouth daily.   omeprazole (PRILOSEC) 40 MG capsule Take 40 mg by mouth every evening.     OZEMPIC , 2 MG/DOSE, 8 MG/3ML SOPN Inject 2 mg into the skin once a week.   potassium chloride SA (KLOR-CON M) 20 MEQ tablet Take 1 tablet (20 mEq total) by mouth daily.   rosuvastatin (CRESTOR) 10 MG tablet Take 10 mg by mouth every evening.    tamsulosin (FLOMAX) 0.4 MG CAPS capsule Take 0.4 mg by mouth daily.   [DISCONTINUED] furosemide (LASIX) 40 MG tablet Take 40 mg twice a day for one week, then decrease to 40 mg daily.     Allergies  Allergen Reactions   Codeine Other (See Comments)    Heart races    Social History   Socioeconomic History   Marital status: Married    Spouse name: Tony Griffin   Number of children: 1   Years of education: Not on file   Highest education level: Not on file  Occupational History   Not on file  Tobacco Use   Smoking status: Never   Smokeless tobacco: Never  Vaping Use   Vaping status: Never Used  Substance and Sexual Activity   Alcohol use: Yes    Comment: occasional   Drug use: Never   Sexual activity: Yes    Partners: Female    Comment: vasectomy-- 2000  Other Topics Concern   Not on file  Social History Narrative   Not on file   Social Drivers of Health  Financial Resource Strain: Not on file  Food Insecurity: Not on file  Transportation Needs: Not on file  Physical Activity: Not on file  Stress: Not on file  Social Connections: Not on file  Intimate Partner Violence: Not on file     Review of Systems: General: negative for chills, fever, night sweats or weight changes.  Cardiovascular: negative for chest pain, dyspnea on exertion, edema, orthopnea, palpitations, paroxysmal nocturnal dyspnea or shortness of breath Dermatological: negative for rash Respiratory: negative for cough or wheezing Urologic: negative for hematuria Abdominal: negative for nausea, vomiting, diarrhea, bright red blood per rectum, melena, or hematemesis Neurologic: negative for visual changes, syncope, or dizziness All other systems reviewed and are  otherwise negative except as noted above.    Blood pressure 112/70, pulse 94, height 6' (1.829 m), weight (!) 340 lb (154.2 kg), SpO2 96%.  General appearance: alert and no distress Neck: no adenopathy, no carotid bruit, no JVD, supple, symmetrical, trachea midline, and thyroid not enlarged, symmetric, no tenderness/mass/nodules Lungs: clear to auscultation bilaterally Heart: regular rate and rhythm, S1, S2 normal, no murmur, click, rub or gallop Extremities: 2+ pitting LE edema bilaterally Pulses: 2+ and symmetric Skin: Skin color, texture, turgor normal. No rashes or lesions Neurologic: Grossly normal  EKG  EKG Interpretation Date/Time:  Monday September 25 2024 11:20:59 EST Ventricular Rate:  80 PR Interval:  140 QRS Duration:  152 QT Interval:  438 QTC Calculation: 505 R Axis:   -26  Text Interpretation: Normal sinus rhythm with sinus arrhythmia Left bundle branch block When compared with ECG of 12-Aug-2024 15:21, QRS axis Shifted left T wave inversion no longer evident in Inferior leads T wave inversion less evident in Lateral leads Confirmed by Court Carrier [phone]) on 09/25/2024 11:28:56 AM    ASSESSMENT AND PLAN:   HTN (hypertension) History of essential hypertension with blood pressure measured today at 112/70.  He is on lisinopril.  Dyslipidemia History of dyslipidemia on rosuvastatin with lipid profile performed 08/28/2024 revealing total cholesterol 119, LDL 64 and HDL 29.  Obstructive sleep apnea Symptoms compatible with obstructive sleep apnea.  Outpatient sleep study has yet to be scheduled.  Left ventricular dysfunction Patient was seen by me 09/05/2024 after presenting to the ER with shortness of breath 104//25 with an elevated BNP.  I did get a 2D echo 09/13/2024 revealing EF of 25 to 30% with mildly dilated LV and grade 1 diastolic dysfunction.  There is no significant valvular abnormality.  A coronary calcium score performed 09/13/2024 was 263 with the  majority of his calcium in the LAD and circumflex.  I have arranged for him to undergo right and left heart cath by Dr. Jordan to determine the etiology of his LV dysfunction.  In the meantime, I am going to increase his furosemide to 40 mg p.o. twice daily since he still has some pitting edema and add low-dose carvedilol.  He will ultimately need to transition from lisinopril to Entresto which we will have done by Pharm.D.     Carrier DOROTHA Court MD FACP,FACC,FAHA, [facility] 09/25/2024 11:29 AM

## 2024-09-25 NOTE — H&P (View-Only) (Signed)
 09/25/2024 Tony Griffin.   04-02-1956  [id number]  [facility], [facility] Primary Cardiologist: Dorn JINNY Lesches MD [facility], [place], [place]  HPI:  Tony Griffin. is a 68 y.o.  morbidly overweight married Caucasian male father 1 child is accompanied by his wife Tony Griffin today.  I last saw him in the office 08/16/2024.  He was referred by the emergency room where he was 08/12/24 with shortness of breath. He currently is retired and was a tax adviser. His risk factors include treated hypertension, diabetes and hyperlipidemia. There is no family history of heart disease. Never had a heart attack or stroke. He denies chest pain. He is experienced increased dyspnea on exertion and orthopnea for 1 month. He did present to the ER on 08/12/2024 with the symptoms. Chest x-ray was clear. His BNP was measured at 490. His furosemide was doubled and he was discharged home with early cardiology follow-up. Since being on this increased dose of Lasix he has felt mildly improved.  Since I saw him a little over a month ago I did get a 2D echocardiogram on him 09/13/2024 revealing an EF of 25 to 30% with mildly dilated LV and grade 1 diastolic dysfunction.  A coronary calcium score was performed at the same day which was 263 the majority of which was in the LAD and circumflex although he denies chest pain.   Current Meds  Medication Sig   carvedilol (COREG) 3.125 MG tablet Take 1 tablet (3.125 mg total) by mouth 2 (two) times daily.   Cholecalciferol (VITAMIN D-3 PO) Take 1 capsule by mouth daily.   glipiZIDE  (GLUCOTROL ) 10 MG tablet Take 1 tablet (10 mg total) by mouth 2 (two) times daily before a meal.   lisinopril (PRINIVIL,ZESTRIL) 10 MG tablet Take 10 mg by mouth every evening.    MAGNESIUM PO Take 1 tablet by mouth daily.   metFORMIN  (GLUCOPHAGE -XR) 500 MG 24 hr tablet Take 4 tablets (2,000 mg total) by mouth daily.   omeprazole (PRILOSEC) 40 MG capsule Take 40 mg by mouth every evening.     OZEMPIC , 2 MG/DOSE, 8 MG/3ML SOPN Inject 2 mg into the skin once a week.   potassium chloride SA (KLOR-CON M) 20 MEQ tablet Take 1 tablet (20 mEq total) by mouth daily.   rosuvastatin (CRESTOR) 10 MG tablet Take 10 mg by mouth every evening.    tamsulosin (FLOMAX) 0.4 MG CAPS capsule Take 0.4 mg by mouth daily.   [DISCONTINUED] furosemide (LASIX) 40 MG tablet Take 40 mg twice a day for one week, then decrease to 40 mg daily.     Allergies  Allergen Reactions   Codeine Other (See Comments)    Heart races    Social History   Socioeconomic History   Marital status: Married    Spouse name: Tony Griffin   Number of children: 1   Years of education: Not on file   Highest education level: Not on file  Occupational History   Not on file  Tobacco Use   Smoking status: Never   Smokeless tobacco: Never  Vaping Use   Vaping status: Never Used  Substance and Sexual Activity   Alcohol use: Yes    Comment: occasional   Drug use: Never   Sexual activity: Yes    Partners: Female    Comment: vasectomy-- 2000  Other Topics Concern   Not on file  Social History Narrative   Not on file   Social Drivers of Health  Financial Resource Strain: Not on file  Food Insecurity: Not on file  Transportation Needs: Not on file  Physical Activity: Not on file  Stress: Not on file  Social Connections: Not on file  Intimate Partner Violence: Not on file     Review of Systems: General: negative for chills, fever, night sweats or weight changes.  Cardiovascular: negative for chest pain, dyspnea on exertion, edema, orthopnea, palpitations, paroxysmal nocturnal dyspnea or shortness of breath Dermatological: negative for rash Respiratory: negative for cough or wheezing Urologic: negative for hematuria Abdominal: negative for nausea, vomiting, diarrhea, bright red blood per rectum, melena, or hematemesis Neurologic: negative for visual changes, syncope, or dizziness All other systems reviewed and are  otherwise negative except as noted above.    Blood pressure 112/70, pulse 94, height 6' (1.829 m), weight (!) 340 lb (154.2 kg), SpO2 96%.  General appearance: alert and no distress Neck: no adenopathy, no carotid bruit, no JVD, supple, symmetrical, trachea midline, and thyroid not enlarged, symmetric, no tenderness/mass/nodules Lungs: clear to auscultation bilaterally Heart: regular rate and rhythm, S1, S2 normal, no murmur, click, rub or gallop Extremities: 2+ pitting LE edema bilaterally Pulses: 2+ and symmetric Skin: Skin color, texture, turgor normal. No rashes or lesions Neurologic: Grossly normal  EKG  EKG Interpretation Date/Time:  Monday September 25 2024 11:20:59 EST Ventricular Rate:  80 PR Interval:  140 QRS Duration:  152 QT Interval:  438 QTC Calculation: 505 R Axis:   -26  Text Interpretation: Normal sinus rhythm with sinus arrhythmia Left bundle branch block When compared with ECG of 12-Aug-2024 15:21, QRS axis Shifted left T wave inversion no longer evident in Inferior leads T wave inversion less evident in Lateral leads Confirmed by Court Carrier [phone]) on 09/25/2024 11:28:56 AM    ASSESSMENT AND PLAN:   HTN (hypertension) History of essential hypertension with blood pressure measured today at 112/70.  He is on lisinopril.  Dyslipidemia History of dyslipidemia on rosuvastatin with lipid profile performed 08/28/2024 revealing total cholesterol 119, LDL 64 and HDL 29.  Obstructive sleep apnea Symptoms compatible with obstructive sleep apnea.  Outpatient sleep study has yet to be scheduled.  Left ventricular dysfunction Patient was seen by me 09/05/2024 after presenting to the ER with shortness of breath 104//25 with an elevated BNP.  I did get a 2D echo 09/13/2024 revealing EF of 25 to 30% with mildly dilated LV and grade 1 diastolic dysfunction.  There is no significant valvular abnormality.  A coronary calcium score performed 09/13/2024 was 263 with the  majority of his calcium in the LAD and circumflex.  I have arranged for him to undergo right and left heart cath by Dr. Jordan to determine the etiology of his LV dysfunction.  In the meantime, I am going to increase his furosemide to 40 mg p.o. twice daily since he still has some pitting edema and add low-dose carvedilol.  He will ultimately need to transition from lisinopril to Entresto which we will have done by Pharm.D.     Carrier DOROTHA Court MD FACP,FACC,FAHA, [facility] 09/25/2024 11:29 AM

## 2024-09-25 NOTE — Assessment & Plan Note (Signed)
 Patient was seen by me 09/05/2024 after presenting to the ER with shortness of breath 104//25 with an elevated BNP.  I did get a 2D echo 09/13/2024 revealing EF of 25 to 30% with mildly dilated LV and grade 1 diastolic dysfunction.  There is no significant valvular abnormality.  A coronary calcium score performed 09/13/2024 was 263 with the majority of his calcium in the LAD and circumflex.  I have arranged for him to undergo right and left heart cath by Dr. Jordan to determine the etiology of his LV dysfunction.  In the meantime, I am going to increase his furosemide to 40 mg p.o. twice daily since he still has some pitting edema and add low-dose carvedilol.  He will ultimately need to transition from lisinopril to Entresto which we will have done by Pharm.D.

## 2024-09-25 NOTE — Assessment & Plan Note (Signed)
 Symptoms compatible with obstructive sleep apnea.  Outpatient sleep study has yet to be scheduled.

## 2024-09-25 NOTE — Patient Instructions (Signed)
 Medication Instructions:  Your physician has recommended you make the following change in your medication:   -Start carvedilol (coreg) 3.125mg  twice daily.  -Increase furosemide (lasix) to 40mg  twice dauily.  *If you need a refill on your cardiac medications before your next appointment, please call your pharmacy*  Lab Work: Today- BMET & CBC   If you have labs (blood work) drawn today and your tests are completely normal, you will receive your results only by: MyChart Message (if you have MyChart) OR A paper copy in the mail If you have any lab test that is abnormal or we need to change your treatment, we will call you to review the results.  Testing/Procedures: See below  Follow-Up: At [facility], you and your health needs are our priority.  As part of our continuing mission to provide you with exceptional heart care, our providers are all part of one team.  This team includes your primary Cardiologist (physician) and Advanced Practice Providers or APPs (Physician Assistants and Nurse Practitioners) who all work together to provide you with the care you need, when you need it.  Your next appointment:   2 week(s) after heart cath (11/19)  Provider:   Jon Hails, PA-C, Callie Goodrich, PA-C, Kathleen Johnson, PA-C, Hao Meng, PA-C, Damien Braver, NP, Glendia Ferrier, PA-C, or Katlyn West, NP         Then, Dorn Lesches, MD will plan to see you again in 3 month(s).    We recommend signing up for the patient portal called MyChart.  Sign up information is provided on this After Visit Summary.  MyChart is used to connect with patients for Virtual Visits (Telemedicine).  Patients are able to view lab/test results, encounter notes, upcoming appointments, etc.  Non-urgent messages can be sent to your provider as well.   To learn more about what you can do with MyChart, go to [email or web address].   Other Instructions We will set you up with an appointment to see a clinical  PharmD for medication adjustments.         Cardiac/Peripheral Catheterization   You are scheduled for a Cardiac Catheterization on Wednesday, November 19 with Dr. Peter Jordan.  1. Please arrive at the [facility] (Main Entrance A) at [facility]: [address] [place], [place] [address] at 11:00 AM (This time is 2 hour(s) before your procedure to ensure your preparation).   Free valet parking service is available. You will check in at ADMITTING. The support person will be asked to wait in the waiting room.  It is OK to have someone drop you off and come back when you are ready to be discharged.        Special note: Every effort is made to have your procedure done on time. Please understand that emergencies sometimes delay scheduled procedures.  2. Diet: Light meals may be eaten up to 6 hours before scheduled procedures from 12N and after; please stop eating at 7:00 AM   Light meal consist of plain toast, fruit, light soups, crackers.  3. Hydration:On November 19, you may drink approved liquids (see below) until 2 hours before the procedure with 8 oz of water as your last intake.   List of approved liquids water, clear juice, clear tea, black coffee, fruit juices, non-citric and without pulp, carbonated beverages, Gatorade, Kool -Aid, plain Jello-O and plain ice popsicles.  4. Labs: You will need to have blood drawn today (11/17).  5. Medication instructions in preparation for your procedure:  Stop taking, Lasix (Furosemide)  Wednesday, November 19,    Do not take Diabetes Med Glucophage  (Metformin ) and Glipizide  on the day of the procedure and HOLD 48 HOURS AFTER THE PROCEDURE.  On the morning of your procedure, take Aspirin 81 mg and any morning medicines NOT listed above.  You may use sips of water.  6. Plan to go home the same day, you will only stay overnight if medically necessary. 7. You MUST have a responsible adult to drive you home. 8. An adult MUST be  with you the first 24 hours after you arrive home. 9. Bring a current list of your medications, and the last time and date medication taken. 10. Bring ID and current insurance cards. 11.Please wear clothes that are easy to get on and off and wear slip-on shoes.  Thank you for allowing us  to care for you!   -- [facility]

## 2024-09-26 ENCOUNTER — Ambulatory Visit: Payer: Self-pay | Admitting: Cardiovascular Disease

## 2024-09-26 ENCOUNTER — Other Ambulatory Visit: Payer: Self-pay | Admitting: Cardiovascular Disease

## 2024-09-26 ENCOUNTER — Telehealth: Payer: Self-pay | Admitting: *Deleted

## 2024-09-26 LAB — BASIC METABOLIC PANEL WITH GFR
BUN/Creatinine Ratio: 17 (ref 10–24)
BUN: 16 mg/dL (ref 8–27)
CO2: 24 mmol/L (ref 20–29)
Calcium: 9.6 mg/dL (ref 8.6–10.2)
Chloride: 102 mmol/L (ref 96–106)
Creatinine, Ser: 0.96 mg/dL (ref 0.76–1.27)
Glucose: 128 mg/dL — AB (ref 70–99)
Potassium: 4.4 mmol/L (ref 3.5–5.2)
Sodium: 140 mmol/L (ref 134–144)
eGFR: 86 mL/min/1.73 (ref >59)

## 2024-09-26 LAB — CBC
Hematocrit: 47.4 % (ref 37.5–51.0)
Hemoglobin: 15.5 g/dL (ref 13.0–17.7)
MCH: 29.2 pg (ref 26.6–33.0)
MCHC: 32.7 g/dL (ref 31.5–35.7)
MCV: 89 fL (ref 79–97)
Platelets: 193 x10E3/uL (ref 150–450)
RBC: 5.3 x10E6/uL (ref 4.14–5.80)
RDW: 13.4 % (ref 11.6–15.4)
WBC: 9.9 x10E3/uL (ref 3.4–10.8)

## 2024-09-26 NOTE — Telephone Encounter (Signed)
 Cardiac Catheterization scheduled at [facility] for: Wednesday September 27, 2024 1 PM Arrival time [facility] Main Entrance A at: 11 AM  Diet: -Nothing to eat after midnight.  -May have light meal until 7  AM. (6 hours before procedure time) Approved light meal consists of plain toast, fruit, light soups, crackers.  Hydration: -May drink clear liquids until 2 hours before the procedure.  Approved liquids: Water, clear tea, black coffee, fruit juices-non-citric and without pulp,Gatorade, plain Jello/popsicles.   -Please drink 8 oz of water 2 hours before procedure.  Medication instructions: -Hold:  Metformin -day of procedure and 48 hours post procedure  Glipizide /Lasix/KCl-AM of procedure -Other usual morning medications can be taken including aspirin 81 mg.  Patient tells me Ozempic  is weekly on Sundays.  Plan to go home the same day, you will only stay overnight if medically necessary.  You must have responsible adult to drive you home.  Someone must be with you the first 24 hours after you arrive home.  Reviewed procedure instructions with patient.

## 2024-09-26 NOTE — Progress Notes (Signed)
Erroneous encounter created.

## 2024-09-27 ENCOUNTER — Encounter (HOSPITAL_COMMUNITY): Admission: RE | Disposition: A | Payer: Self-pay | Source: Home / Self Care | Attending: Cardiology

## 2024-09-27 ENCOUNTER — Ambulatory Visit (HOSPITAL_COMMUNITY)
Admission: RE | Admit: 2024-09-27 | Discharge: 2024-09-27 | Disposition: A | Discharge status: Home / Self Care | Attending: Cardiology | Admitting: Cardiology

## 2024-09-27 ENCOUNTER — Other Ambulatory Visit: Payer: Self-pay

## 2024-09-27 ENCOUNTER — Encounter (HOSPITAL_COMMUNITY): Payer: Self-pay | Admitting: Cardiology

## 2024-09-27 DIAGNOSIS — Z7984 Long term (current) use of oral hypoglycemic drugs: Secondary | ICD-10-CM | POA: Diagnosis not present

## 2024-09-27 DIAGNOSIS — Z7985 Long-term (current) use of injectable non-insulin antidiabetic drugs: Secondary | ICD-10-CM | POA: Insufficient documentation

## 2024-09-27 DIAGNOSIS — E785 Hyperlipidemia, unspecified: Secondary | ICD-10-CM | POA: Diagnosis not present

## 2024-09-27 DIAGNOSIS — I2584 Coronary atherosclerosis due to calcified coronary lesion: Secondary | ICD-10-CM | POA: Diagnosis not present

## 2024-09-27 DIAGNOSIS — I272 Pulmonary hypertension, unspecified: Secondary | ICD-10-CM | POA: Diagnosis not present

## 2024-09-27 DIAGNOSIS — E119 Type 2 diabetes mellitus without complications: Secondary | ICD-10-CM | POA: Diagnosis not present

## 2024-09-27 DIAGNOSIS — I2510 Atherosclerotic heart disease of native coronary artery without angina pectoris: Secondary | ICD-10-CM | POA: Diagnosis not present

## 2024-09-27 DIAGNOSIS — I110 Hypertensive heart disease with heart failure: Secondary | ICD-10-CM | POA: Diagnosis present

## 2024-09-27 DIAGNOSIS — I5043 Acute on chronic combined systolic (congestive) and diastolic (congestive) heart failure: Secondary | ICD-10-CM | POA: Diagnosis not present

## 2024-09-27 DIAGNOSIS — G4733 Obstructive sleep apnea (adult) (pediatric): Secondary | ICD-10-CM | POA: Diagnosis not present

## 2024-09-27 DIAGNOSIS — I270 Primary pulmonary hypertension: Secondary | ICD-10-CM | POA: Diagnosis not present

## 2024-09-27 DIAGNOSIS — Z79899 Other long term (current) drug therapy: Secondary | ICD-10-CM | POA: Diagnosis not present

## 2024-09-27 DIAGNOSIS — I519 Heart disease, unspecified: Secondary | ICD-10-CM

## 2024-09-27 HISTORY — PX: RIGHT/LEFT HEART CATH AND CORONARY ANGIOGRAPHY: CATH118266

## 2024-09-27 LAB — POCT I-STAT EG7
Acid-Base Excess: 2 mmol/L (ref 0.0–2.0)
Acid-base deficit: 1 mmol/L (ref 0.0–2.0)
Bicarbonate: 28.2 mmol/L — ABNORMAL HIGH (ref 20.0–28.0)
Bicarbonate: 25.3 mmol/L (ref 20.0–28.0)
Calcium, Ion: 1.24 mmol/L (ref 1.15–1.40)
Calcium, Ion: 1.15 mmol/L (ref 1.15–1.40)
HCT: 42 % (ref 39.0–52.0)
HCT: 41 % (ref 39.0–52.0)
Hemoglobin: 14.3 g/dL (ref 13.0–17.0)
Hemoglobin: 13.9 g/dL (ref 13.0–17.0)
O2 Saturation: 69 %
O2 Saturation: 67 %
Potassium: 4.1 mmol/L (ref 3.5–5.1)
Potassium: 4 mmol/L (ref 3.5–5.1)
Sodium: 139 mmol/L (ref 135–145)
Sodium: 137 mmol/L (ref 135–145)
TCO2: 30 mmol/L (ref 22–32)
TCO2: 27 mmol/L (ref 22–32)
pCO2, Ven: 49.7 mmHg (ref 44–60)
pCO2, Ven: 46.4 mmHg (ref 44–60)
pH, Ven: 7.362 (ref 7.25–7.43)
pH, Ven: 7.344 (ref 7.25–7.43)
pO2, Ven: 38 mmHg (ref 32–45)
pO2, Ven: 37 mmHg (ref 32–45)

## 2024-09-27 LAB — POCT I-STAT 7, (LYTES, BLD GAS, ICA,H+H)
Acid-Base Excess: 0 mmol/L (ref 0.0–2.0)
Bicarbonate: 24.1 mmol/L (ref 20.0–28.0)
Calcium, Ion: 1.2 mmol/L (ref 1.15–1.40)
HCT: 41 % (ref 39.0–52.0)
Hemoglobin: 13.9 g/dL (ref 13.0–17.0)
O2 Saturation: 96 %
Potassium: 4.1 mmol/L (ref 3.5–5.1)
Sodium: 139 mmol/L (ref 135–145)
TCO2: 25 mmol/L (ref 22–32)
pCO2 arterial: 38.6 mmHg (ref 32–48)
pH, Arterial: 7.404 (ref 7.35–7.45)
pO2, Arterial: 82 mmHg — ABNORMAL LOW (ref 83–108)

## 2024-09-27 LAB — GLUCOSE, CAPILLARY
Glucose-Capillary: 167 mg/dL — ABNORMAL HIGH (ref 70–99)
Glucose-Capillary: 158 mg/dL — ABNORMAL HIGH (ref 70–99)

## 2024-09-27 SURGERY — RIGHT/LEFT HEART CATH AND CORONARY ANGIOGRAPHY
Anesthesia: LOCAL

## 2024-09-27 MED ORDER — SODIUM CHLORIDE 0.9 % IV SOLN: 250.0000 mL | INTRAVENOUS | Status: DC | PRN

## 2024-09-27 MED ORDER — HEPARIN (PORCINE) IN NACL 1000-0.9 UT/500ML-% IV SOLN
INTRAVENOUS | Status: DC | PRN
  Administered 2024-09-27: 1000 mL

## 2024-09-27 MED ORDER — FENTANYL CITRATE (PF) 100 MCG/2ML IJ SOLN
INTRAMUSCULAR | Status: AC
  Filled 2024-09-27: qty 2

## 2024-09-27 MED ORDER — MIDAZOLAM HCL (PF) 2 MG/2ML IJ SOLN
INTRAMUSCULAR | Status: DC | PRN
  Administered 2024-09-27: 1 mg via INTRAVENOUS

## 2024-09-27 MED ORDER — SODIUM CHLORIDE 0.9% FLUSH: 3.0000 mL | INTRAVENOUS | Status: DC | PRN

## 2024-09-27 MED ORDER — SODIUM CHLORIDE 0.9% FLUSH: 3.0000 mL | Freq: Two times a day (BID) | INTRAVENOUS | Status: DC

## 2024-09-27 MED ORDER — ACETAMINOPHEN 325 MG PO TABS: 650.0000 mg | ORAL_TABLET | ORAL | Status: DC | PRN

## 2024-09-27 MED ORDER — HYDRALAZINE HCL 20 MG/ML IJ SOLN: 10.0000 mg | INTRAMUSCULAR | Status: DC | PRN

## 2024-09-27 MED ORDER — LIDOCAINE HCL (PF) 1 % IJ SOLN
INTRAMUSCULAR | Status: DC | PRN
Start: 2024-09-27 — End: 2024-09-27
  Administered 2024-09-27: 2 mL via INTRADERMAL

## 2024-09-27 MED ORDER — IOHEXOL 350 MG/ML SOLN
INTRAVENOUS | Status: DC | PRN
  Administered 2024-09-27: 40 mL

## 2024-09-27 MED ORDER — METFORMIN HCL ER 500 MG PO TB24: 2000.0000 mg | ORAL_TABLET | Freq: Every day | ORAL | Status: DC

## 2024-09-27 MED ORDER — LIDOCAINE HCL (PF) 1 % IJ SOLN
INTRAMUSCULAR | Status: AC
  Filled 2024-09-27: qty 30

## 2024-09-27 MED ORDER — HEPARIN SODIUM (PORCINE) 1000 UNIT/ML IJ SOLN
INTRAMUSCULAR | Status: DC | PRN
  Administered 2024-09-27: 6000 [IU] via INTRAVENOUS

## 2024-09-27 MED ORDER — FREE WATER: 250.0000 mL | Freq: Once | Status: DC

## 2024-09-27 MED ORDER — MIDAZOLAM HCL 2 MG/2ML IJ SOLN
INTRAMUSCULAR | Status: AC
  Filled 2024-09-27: qty 2

## 2024-09-27 MED ORDER — VERAPAMIL HCL 2.5 MG/ML IV SOLN: INTRAVENOUS | Status: DC | PRN

## 2024-09-27 MED ORDER — VERAPAMIL HCL 2.5 MG/ML IV SOLN
INTRAVENOUS | Status: AC
Start: 2024-09-27 — End: 2024-09-27
  Filled 2024-09-27: qty 2

## 2024-09-27 MED ORDER — FENTANYL CITRATE (PF) 100 MCG/2ML IJ SOLN
INTRAMUSCULAR | Status: DC | PRN
  Administered 2024-09-27: 25 ug via INTRAVENOUS

## 2024-09-27 MED ORDER — ONDANSETRON HCL 4 MG/2ML IJ SOLN: 4.0000 mg | Freq: Four times a day (QID) | INTRAMUSCULAR | Status: DC | PRN

## 2024-09-27 MED ORDER — HEPARIN SODIUM (PORCINE) 1000 UNIT/ML IJ SOLN
INTRAMUSCULAR | Status: AC
  Filled 2024-09-27: qty 10

## 2024-09-27 SURGICAL SUPPLY — 10 items
CATH BALLN WEDGE 5F 110CM (CATHETERS) IMPLANT
CATH INFINITI 5 FR 3DRC (CATHETERS) IMPLANT
CATH INFINITI 5 FR JL3.5 (CATHETERS) IMPLANT
CATH INFINITI JR4 5F (CATHETERS) IMPLANT
GLIDESHEATH SLEND SS 6F .021 (SHEATH) IMPLANT
GUIDEWIRE INQWIRE 1.5J.035X260 (WIRE) IMPLANT
PACK CARDIAC CATHETERIZATION (CUSTOM PROCEDURE TRAY) ×1 IMPLANT
SET ATX-X65L (MISCELLANEOUS) IMPLANT
SHEATH GLIDE SLENDER 4/5FR (SHEATH) IMPLANT
SHEATH PROBE COVER 6X72 (BAG) IMPLANT

## 2024-09-27 NOTE — Progress Notes (Addendum)
 Patient and patient wife given discharge instructions, education provided no further questions at this time. Verified with MD Jordan patient does not need to be taking aspirin daily, and asked RN to cross this off on discharge packet, education provided. Patient able to ambulate and void before discharge. Able to tolerate PO intake. Patient sites are clean, dry, intact with no hematoma noted upon discharge.

## 2024-09-27 NOTE — Interval H&P Note (Signed)
 History and Physical Interval Note:  09/27/2024 11:58 AM  Tony Griffin.  has presented today for surgery, with the diagnosis of heart failure.  The various methods of treatment have been discussed with the patient and family. After consideration of risks, benefits and other options for treatment, the patient has consented to  Procedure(s): RIGHT/LEFT HEART CATH AND CORONARY ANGIOGRAPHY (N/A) as a surgical intervention.  The patient's history has been reviewed, patient examined, no change in status, stable for surgery.  I have reviewed the patient's chart and labs.  Questions were answered to the patient's satisfaction.   Cath Lab Visit (complete for each Cath Lab visit)  Clinical Evaluation Leading to the Procedure:   ACS: No.  Non-ACS:    Anginal Classification: CCS II  Anti-ischemic medical therapy: Minimal Therapy (1 class of medications)  Non-Invasive Test Results: No non-invasive testing performed  Prior CABG: No previous CABG        [facility] 09/27/2024 11:58 AM

## 2024-10-03 NOTE — Progress Notes (Unsigned)
 Cardiology Office Note    Date:  10/11/2024  ID:  Tony Alen., DOB 1956/04/10, MRN [id number] PCP:  [facility]  Cardiologist:  None  Electrophysiologist:  None   Chief Complaint: Follow up for HFrEF   History of Present Illness: .   Tony Zayvien Canning. is a 68 y.o. male with visit-pertinent history of hypertension, hyperlipidemia and diabetes.  Patient first evaluated by Dr. Court on 08/16/2024 for shortness of breath.  Patient had previously been evaluated in the ED for increased dyspnea on exertion and orthopnea for 1 month.  Patient's chest x-ray was clear and his BNP was measured at 490.  His furosemide  was doubled and he was discharged with follow-up with cardiology.  Patient reported some mild improvement with increased dose of Lasix .  Echocardiogram on 09/13/2024 indicated LVEF 25 to 30%, no RWMA, LV internal cavity size is mildly dilated, mild eccentric left ventricular hypertrophy, G1 DD, RV systolic function and size was normal, normal PASP, mild mitral valve regurgitation with no evidence of stenosis, aortic valve regurgitation was not visualized, no stenosis was present.  Patient's coronary calcium  score was 263.  Patient underwent cardiac catheterization on 09/27/2024 indicating mild nonobstructive CAD, pulmonary venous hypertension.  Today he presents for follow-up.  He reports that he has been overall.  He denies any chest pain, increased shortness of breath, lower extremity edema, orthopnea or PND.  He denies any palpitations, presyncope or syncope.  He reports that his weights have been stable if not downtrending at home.  He reports that he is been tolerating his medications well, his blood pressure has been well-controlled.  ROS: .   Today he denies chest pain, shortness of breath, lower extremity edema, fatigue, palpitations, melena, hematuria, hemoptysis, diaphoresis, weakness, presyncope, syncope, orthopnea, and PND.  All other systems are reviewed and otherwise  negative. Studies Reviewed: SABRA   EKG:  EKG is ordered today, personally reviewed, demonstrating  EKG Interpretation Date/Time:  Wednesday October 11 2024 13:52:15 EST Ventricular Rate:  77 PR Interval:  158 QRS Duration:  150 QT Interval:  448 QTC Calculation: 506 R Axis:   -23  Text Interpretation: Normal sinus rhythm Left bundle branch block When compared with ECG of 25-Sep-2024 11:20, No significant change was found Confirmed by Aurthur Wingerter [id number]) on 10/11/2024 4:05:36 PM   CV Studies: Cardiac studies reviewed are outlined and summarized above. Otherwise please see EMR for full report. Cardiac Studies & Procedures   ______________________________________________________________________________________________ CARDIAC CATHETERIZATION  CARDIAC CATHETERIZATION 09/27/2024  Conclusion   LV end diastolic pressure is moderately elevated.   Hemodynamic findings consistent with mild pulmonary hypertension.  Mild nonobstructive CAD Elevated LV filling pressures. PCWP 28/27, mean 29 mm Hg. LVEDP 34 mm Hg Pulmonary venous HTN. PAP 49/23, mean 34 mm Hg RA pressure 21/16, mean 14 mm Hg Cardiac output 6.01 with index 2.25  Plan; optimize medical therapy for CHF  Findings Coronary Findings Diagnostic  Dominance: Right  Left Main Vessel was injected. Vessel is normal in caliber. Vessel is angiographically normal.  Left Anterior Descending Vessel was injected. Vessel is normal in caliber. The vessel exhibits minimal luminal irregularities. The vessel is moderately calcified.  Left Circumflex Vessel was injected. Vessel is normal in caliber. Vessel is angiographically normal.  Right Coronary Artery Vessel was injected. Vessel is normal in caliber. The vessel exhibits minimal luminal irregularities.  Intervention  No interventions have been documented.     ECHOCARDIOGRAM  ECHOCARDIOGRAM COMPLETE 09/13/2024  Narrative ECHOCARDIOGRAM REPORT    Patient Name:  Tony Mase Jr.  Date of Exam: 09/13/2024 Medical Rec #:  [id number]     Height:       72.0 in Accession #:    [id number]    Weight:       340.0 lb Date of Birth:  [birth year]     BSA:          2.670 m Patient Age:    68 years      BP:           127/85 mmHg Patient Gender: M             HR:           80 bpm. Exam Location:  [address]  Procedure: 2D Echo, 3D Echo, Cardiac Doppler, Color Doppler and Intracardiac Opacification Agent (Both Spectral and Color Flow Doppler were utilized during procedure).  Indications:    E87.70 Hypervolemia  History:        Patient has no prior history of Echocardiogram examinations. Signs/Symptoms:Dyspnea; Risk Factors:Hypertension, Diabetes, HLD, Dyslipidemia and Sleep Apnea.  Sonographer:    Waldo Guadalajara RCS Referring Phys: [id number] JONATHAN J BERRY  IMPRESSIONS   1. Left ventricular ejection fraction, by estimation, is 25 to 30%. Left ventricular ejection fraction by 3D volume is 26 %. The left ventricle has severely decreased function. The left ventricle has no regional wall motion abnormalities. The left ventricular internal cavity size was mildly dilated. There is mild eccentric left ventricular hypertrophy. Left ventricular diastolic parameters are consistent with Grade I diastolic dysfunction (impaired relaxation). 2. Right ventricular systolic function is normal. The right ventricular size is normal. There is normal pulmonary artery systolic pressure. 3. The mitral valve is normal in structure. Mild mitral valve regurgitation. No evidence of mitral stenosis. 4. The aortic valve is normal in structure. Aortic valve regurgitation is not visualized. No aortic stenosis is present. 5. The inferior vena cava is normal in size with greater than 50% respiratory variability, suggesting right atrial pressure of 3 mmHg.  FINDINGS Left Ventricle: Left ventricular ejection fraction, by estimation, is 25 to 30%. Left ventricular ejection fraction by 3D volume is 26 %. The left  ventricle has severely decreased function. The left ventricle has no regional wall motion abnormalities. The left ventricular internal cavity size was mildly dilated. There is mild eccentric left ventricular hypertrophy. Left ventricular diastolic parameters are consistent with Grade I diastolic dysfunction (impaired relaxation).   LV Wall Scoring: The entire septum and basal inferior segment are akinetic. The entire anterior wall, entire lateral wall, and mid and distal inferior wall are hypokinetic.  Right Ventricle: The right ventricular size is normal. No increase in right ventricular wall thickness. Right ventricular systolic function is normal. There is normal pulmonary artery systolic pressure. The tricuspid regurgitant velocity is 2.70 m/s, and with an assumed right atrial pressure of 3 mmHg, the estimated right ventricular systolic pressure is 32.2 mmHg.  Left Atrium: Left atrial size was normal in size.  Right Atrium: Right atrial size was normal in size.  Pericardium: There is no evidence of pericardial effusion.  Mitral Valve: The mitral valve is normal in structure. Mild mitral valve regurgitation. No evidence of mitral valve stenosis.  Tricuspid Valve: The tricuspid valve is normal in structure. Tricuspid valve regurgitation is not demonstrated. No evidence of tricuspid stenosis.  Aortic Valve: The aortic valve is normal in structure. Aortic valve regurgitation is not visualized. No aortic stenosis is present.  Pulmonic Valve: The pulmonic valve was normal in structure. Pulmonic valve regurgitation  is not visualized. No evidence of pulmonic stenosis.  Aorta: The aortic root is normal in size and structure.  Venous: The inferior vena cava is normal in size with greater than 50% respiratory variability, suggesting right atrial pressure of 3 mmHg.  IAS/Shunts: No atrial level shunt detected by color flow Doppler.  Additional Comments: 3D was performed not requiring image  post processing on an independent workstation and was abnormal.   LEFT VENTRICLE PLAX 2D LVIDd:         6.20 cm         Diastology LVIDs:         5.40 cm         LV e' medial:    4.79 cm/s LV PW:         1.40 cm         LV E/e' medial:  19.7 LV IVS:        1.10 cm         LV e' lateral:   5.98 cm/s LVOT diam:     2.20 cm         LV E/e' lateral: 15.8 LV SV:         59 LV SV Index:   22 LVOT Area:     3.80 cm        3D Volume EF LV IVRT:       74 msec         LV 3D EF:    Left ventricul ar ejection fraction by 3D volume is 26 %.  3D Volume EF: 3D EF:        26 % LV EDV:       227 ml LV ESV:       169 ml LV SV:        58 ml  RIGHT VENTRICLE RV Basal diam:  3.70 cm     PULMONARY VEINS RV S prime:     13.30 cm/s  A Reversal Velocity: 31.30 cm/s TAPSE (M-mode): 2.1 cm      Diastolic Velocity:  42.50 cm/s RVSP:           32.2 mmHg   S/D Velocity:        1.10 Systolic Velocity:   47.40 cm/s  LEFT ATRIUM             Index        RIGHT ATRIUM           Index LA diam:        4.90 cm 1.84 cm/m   RA Pressure: 3.00 mmHg LA Vol (A2C):   64.4 ml 24.12 ml/m  RA Area:     12.30 cm LA Vol (A4C):   66.5 ml 24.91 ml/m  RA Volume:   26.50 ml  9.93 ml/m LA Biplane Vol: 66.3 ml 24.84 ml/m AORTIC VALVE LVOT Vmax:   83.00 cm/s LVOT Vmean:  54.500 cm/s LVOT VTI:    0.155 m  AORTA Ao Root diam: 3.90 cm Ao Asc diam:  3.90 cm  MITRAL VALVE                TRICUSPID VALVE MV Area (PHT):              TR Peak grad:   29.2 mmHg MV Decel Time:              TR Vmax:        270.00 cm/s MV E velocity: 94.30 cm/s   Estimated RAP:  3.00  mmHg MV A velocity: 114.00 cm/s  RVSP:           32.2 mmHg MV E/A ratio:  0.83 SHUNTS Systemic VTI:  0.16 m Systemic Diam: 2.20 cm  Tony Griffin Electronically signed by Tony Griffin Signature Date/Time: 09/13/2024/10:42:53 AM    Final      CT SCANS  CT CARDIAC SCORING (SELF PAY ONLY) 09/13/2024  Addendum 09/18/2024 12:11  AM ADDENDUM REPORT: 09/18/2024 00:09  EXAM: OVER-READ INTERPRETATION  CT CHEST  The following report is an over-read performed by radiologist Dr. Oneil Devonshire of [facility] on 09/18/2024. This over-read does not include interpretation of cardiac or coronary anatomy or pathology. The coronary calcium  score interpretation by the cardiologist is attached.  COMPARISON:  None.  FINDINGS: Cardiovascular: There are no significant extracardiac vascular findings.  Mediastinum/Nodes: There are no enlarged lymph nodes within the visualized mediastinum.  Lungs/Pleura: There is no pleural effusion. The visualized lungs appear clear.  Upper abdomen: No significant findings in the visualized upper abdomen.  Musculoskeletal/Chest wall: No chest wall mass or suspicious osseous findings within the visualized chest.  IMPRESSION: No significant extracardiac findings within the visualized chest.   Electronically Signed By: Oneil Devonshire M.D. On: 09/18/2024 00:09  Narrative CLINICAL DATA:  Cardiovascular Disease Risk stratification  EXAM: Coronary Calcium  Score  TECHNIQUE: A gated, non-contrast computed tomography scan of the heart was performed using 2.5 mm slice thickness. Axial images were analyzed on a dedicated workstation. Calcium  scoring of the coronary arteries was performed using the Agatston method.  FINDINGS: Coronary Calcium  Score:  Left main: 0  Left anterior descending artery: 186  Left circumflex artery: 69.3  Right coronary artery: 7.40  Total: 263  Percentile: 65th  Pericardium: Normal.  Aorta: Mildly dilated to 41 mm, level of the main PA bifurcation (non-contrast).  Non-cardiac: See separate report from [facility].  IMPRESSION: 1. Coronary calcium  score of 263. This was 65th percentile for age-, race-, and sex-matched controls.  2. Aorta: Mildly dilated to 41 mm, level of the main PA  bifurcation (non-contrast).  RECOMMENDATIONS: Coronary artery calcium  (CAC) score is a strong predictor of incident coronary heart disease (CHD) and provides predictive information beyond traditional risk factors. CAC scoring is reasonable to use in the decision to withhold, postpone, or initiate statin therapy in intermediate-risk or selected borderline-risk asymptomatic adults (age 78-75 years and LDL-C >=70 to <190 mg/dL) who do not have diabetes or established atherosclerotic cardiovascular disease (ASCVD).* In intermediate-risk (10-year ASCVD risk >=7.5% to <20%) adults or selected borderline-risk (10-year ASCVD risk >=5% to <7.5%) adults in whom a CAC score is measured for the purpose of making a treatment decision the following recommendations have been made:  If CAC=0, it is reasonable to withhold statin therapy and reassess in 5 to 10 years, as long as higher risk conditions are absent (diabetes mellitus, family history of premature CHD in first degree relatives (males <55 years; females <65 years), cigarette smoking, or LDL >=190 mg/dL).  If CAC is 1 to 99, it is reasonable to initiate statin therapy for patients >=88 years of age.  If CAC is >=100 or >=75th percentile, it is reasonable to initiate statin therapy at any age.  Cardiology referral should be considered for patients with CAC scores >=400 or >=75th percentile.  *2018 AHA/ACC/AACVPR/AAPA/ABC/ACPM/ADA/AGS/APhA/ASPC/NLA/PCNA Guideline on the Management of Blood Cholesterol: A Report of the American College of Cardiology/American Heart Association Task Force on Clinical Practice Guidelines. J Am Coll Cardiol. 2019;73(24):3168-3209.  Electronically Signed: By: Vinie  JAYSON Maxcy M.D. On: 09/13/2024 15:09     ______________________________________________________________________________________________       Current Reported Medications:.    Current Meds  Medication Sig   Ascorbic Acid (VITAMIN C PO)  Take 1 tablet by mouth daily at 12 noon.   aspirin 81 MG chewable tablet Chew 81 mg by mouth daily.   carvedilol  (COREG ) 3.125 MG tablet Take 1 tablet (3.125 mg total) by mouth 2 (two) times daily.   Cholecalciferol (VITAMIN D-3 PO) Take 1 capsule by mouth daily.   Cyanocobalamin (VITAMIN B12 PO) Take 1 tablet by mouth daily at 12 noon.   empagliflozin (JARDIANCE) 10 MG TABS tablet Take 1 tablet (10 mg total) by mouth daily.   FOLIC ACID PO Take 1 tablet by mouth daily at 12 noon.   furosemide  (LASIX ) 40 MG tablet Take 1 tablet (40 mg total) by mouth 2 (two) times daily. Take 40 mg twice a day for one week, then decrease to 40 mg daily.   glipiZIDE  (GLUCOTROL ) 10 MG tablet Take 1 tablet (10 mg total) by mouth 2 (two) times daily before a meal.   MAGNESIUM PO Take 1 tablet by mouth daily.   metFORMIN  (GLUCOPHAGE -XR) 500 MG 24 hr tablet Take 4 tablets (2,000 mg total) by mouth daily.   Multiple Vitamins-Minerals (ZINC PO) Take 1 tablet by mouth daily at 12 noon.   omeprazole (PRILOSEC) 40 MG capsule Take 40 mg by mouth every evening.    potassium chloride  SA (KLOR-CON  M) 20 MEQ tablet Take 1 tablet (20 mEq total) by mouth daily.   sacubitril-valsartan (ENTRESTO) 24-26 MG Take 1 tablet by mouth 2 (two) times daily.   Semaglutide , 2 MG/DOSE, (OZEMPIC , 2 MG/DOSE,) 8 MG/3ML SOPN Inject 2 mg into the skin once a week.   tamsulosin (FLOMAX) 0.4 MG CAPS capsule Take 0.4 mg by mouth daily.   [DISCONTINUED] lisinopril (PRINIVIL,ZESTRIL) 10 MG tablet Take 10 mg by mouth every evening.    [DISCONTINUED] rosuvastatin  (CRESTOR ) 10 MG tablet Take 1 tablet (10 mg total) by mouth every evening.    Physical Exam:    VS:  BP 124/70 (BP Location: Right Arm, Patient Position: Sitting, Cuff Size: Large)   Pulse 82   Resp 17   Ht 6' (1.829 m)   Wt (!) 336 lb (152.4 kg)   SpO2 97%   BMI 45.57 kg/m    Wt Readings from Last 3 Encounters:  10/11/24 (!) 336 lb (152.4 kg)  10/09/24 (!) 336 lb (152.4 kg)   09/27/24 (!) 340 lb (154.2 kg)    GEN: Well nourished, well developed in no acute distress NECK: No JVD; No carotid bruits CARDIAC: RRR, no murmurs, rubs, gallops RESPIRATORY:  Clear to auscultation without rales, wheezing or rhonchi  ABDOMEN: Soft, non-tender, non-distended EXTREMITIES:  No edema; No acute deformity     Asessement and Plan:.    HFrEF: Echocardiogram on 09/13/2024 indicated LVEF 25 to 30%, no RWMA, LV internal cavity size is mildly dilated, mild eccentric left ventricular hypertrophy, G1 DD, RV systolic function and size was normal, normal PASP, mild mitral valve regurgitation with no evidence of stenosis, aortic valve regurgitation was not visualized, no stenosis was present.  Cardiac cath on 09/27/2024 indicated mild nonobstructive CAD.  Today he denies any increased shortness of breath, lower extremity edema, orthopnea or PND.  He notes that his swelling has improved, denies any further chest tightness.  He appears euvolemic and well compensated on exam.  Patient agreeable with checking cardiac MRI.  He is  also agreeable to escalating GDMT.  Will have patient discontinue use of lisinopril and start Entresto 24-26 mg twice daily, patient will wait 48 hours before starting Entresto since his last dose of lisinopril, discussed interaction and need for washout period with patient, he verbalized understanding.  He has been started on Jardiance this week by his endocrinologist.  Continue carvedilol  3.125 mg twice daily and Lasix .  Check basic metabolic profile today, repeat in 2 weeks. Reviewed ED precautions  Mild nonobstructive CAD: Cardiac catheterization indicated mild nonobstructive CAD. Stable with no anginal symptoms. No indication for ischemic evaluation.  Heart healthy diet and regular cardiovascular exercise encouraged.  Continue aspirin 81 mg daily, carvedilol  3.125 mg twice daily, Jardiance 10 mg daily, Lasix  40 mg twice daily, Crestor  20 mg daily.  OSA: Sleep study  currently pending.   Hypertension: Blood pressure today 124/70.  Will transition patient from lisinopril to Entresto as noted above, otherwise continue carvedilol  3.125 mg twice daily and Lasix .  Dyslipidemia: Last lipid file indicated total cholesterol 119, HDL 29, triglycerides 132 and LDL 64.  Patient with history of mild obstructive CAD and type 2 diabetes mellitus, recommended goal LDL less than 55.  Patient agreeable to increasing Crestor  to 20 mg daily.  Check fasting lipid profile and LFTs in 6 to 8 weeks.   Disposition: F/u with Kristin Alvastad, RPH on 10/25/24 as scheduled.  Signed, Kartik Fernando D Decker Cogdell, NP

## 2024-10-09 ENCOUNTER — Ambulatory Visit: Admitting: Internal Medicine

## 2024-10-09 ENCOUNTER — Encounter: Payer: Self-pay | Admitting: Internal Medicine

## 2024-10-09 VITALS — BP 122/80 | Ht 72.0 in | Wt 336.0 lb

## 2024-10-09 DIAGNOSIS — Z7985 Long-term (current) use of injectable non-insulin antidiabetic drugs: Secondary | ICD-10-CM | POA: Diagnosis not present

## 2024-10-09 DIAGNOSIS — Z7984 Long term (current) use of oral hypoglycemic drugs: Secondary | ICD-10-CM | POA: Diagnosis not present

## 2024-10-09 DIAGNOSIS — E1142 Type 2 diabetes mellitus with diabetic polyneuropathy: Secondary | ICD-10-CM | POA: Diagnosis not present

## 2024-10-09 LAB — POCT GLYCOSYLATED HEMOGLOBIN (HGB A1C): Hemoglobin A1C: 6.9 % — AB (ref 4.0–5.6)

## 2024-10-09 MED ORDER — GLIPIZIDE 10 MG PO TABS: 10.0000 mg | ORAL_TABLET | Freq: Two times a day (BID) | ORAL | 3 refills | Status: AC

## 2024-10-09 MED ORDER — EMPAGLIFLOZIN 10 MG PO TABS: 10.0000 mg | ORAL_TABLET | Freq: Every day | ORAL | 3 refills | Status: AC

## 2024-10-09 MED ORDER — METFORMIN HCL ER 500 MG PO TB24: 2000.0000 mg | ORAL_TABLET | Freq: Every day | ORAL | 3 refills | Status: AC

## 2024-10-09 MED ORDER — OZEMPIC (2 MG/DOSE) 8 MG/3ML ~~LOC~~ SOPN: 2.0000 mg | PEN_INJECTOR | SUBCUTANEOUS | 3 refills | Status: AC

## 2024-10-09 NOTE — Progress Notes (Signed)
 Name: Tony Griffin.  Age/ Sex: 68 y.o., male   MRN/ DOB: [id number], 10-16-1956     PCP: [facility], [facility]   Reason for Endocrinology Evaluation: Type 2 Diabetes Mellitus  Initial Endocrine Consultative Visit: 07/01/2018    PATIENT IDENTIFIER: [name]. is a 68 y.o. male with a past medical history of DM, HTN, Hx of prostate cancer . The patient has followed with Endocrinology clinic since 07/01/2018 for consultative assistance with management of his diabetes.  DIABETIC HISTORY:  [name] was diagnosed with DM 2017, intolerant to Farxiga  due to UTI's . His hemoglobin A1c has ranged from 5.9% in 2020, peaking at 9.8% in 2019.   He was seen by Dr. Kassie from 2019 until 01/2022  On his initial visit with me 07/2022 his A1c 7.6 % , he was on Bromocriptine , Metformin  , Trulicity  and Farxiga . We stopped Bromocriptine , started Glipizide , switched Trulicity  to Ozempic  and continued farxiga  and Metformin     He has had 2 UTI's on Farxiga  10 mg , but stopped 11/2022   Due to shortage of supply of Ozempic  he was switched to Trulicity , which was subsequently switched to Mounjaro  02/2023   Due to cost of Mounjaro  of $1160, I switched him back to Ozempic  and was provided patient assistance forms 01/2024  SUBJECTIVE:   During the last visit (05/30/2024): A1c 6.8%   Today (10/09/2024): [name] is here for a follow up on diabetes management,.  He checks his blood sugars occasionally.   Since his last visit here, the patient presented to the ED in October, 2025 for chest pain, he was eventually referred to cardiology and was found to have CHF Patient underwent cardiac cath 09/27/2024. Pending sleep study  He follows with urology for history of prostate cancer  Patient has been noted weight loss Shortness of breath improved  Has mild constipation - uses stool softer once weekly    HOME DIABETES REGIMEN:  Metformin  500 mg XR  ,2  tabs BID  Glipizide  10 mg, 2 tabs BID Ozempic  2 mg  weekly     Statin: yes ACE-I/ARB: yes    METER DOWNLOAD SUMMARY: Did not bring    DIABETIC COMPLICATIONS: Microvascular complications:   Denies: CKD, neuropathy Last Eye Exam: Completed 2022  Macrovascular complications:   Denies: CAD, CVA, PVD   HISTORY:  Past Medical History:  Past Medical History:  Diagnosis Date   At risk for sleep apnea    STOP--BANG SCORE= 6  (sent to pt's pcp 10-12-2018)   Dyslipidemia    HTN (hypertension)    Hyperplasia of prostate with lower urinary tract symptoms (LUTS)    Prostate cancer [facility]) urologist-- dr bell/  oncologist-- dr patrcia   dx 06-23-2018--- Stage T1c,  Gleason 4+3,  PSA 8.8--- plan IMRT    Type 2 diabetes mellitus [facility])    endocrinologist-- dr kassie   Wears glasses    Past Surgical History:  Past Surgical History:  Procedure Laterality Date   GOLD SEED IMPLANT N/A 10/19/2018   Procedure: GOLD SEED IMPLANT;  Surgeon: Devere Lonni Righter, MD;  Location: [facility];  Service: Urology;  Laterality: N/A;   NO PAST SURGERIES     PROSTATE BIOPSY  06-23-2018   dr bell office   RIGHT/LEFT HEART CATH AND CORONARY ANGIOGRAPHY N/A 09/27/2024   Procedure: RIGHT/LEFT HEART CATH AND CORONARY ANGIOGRAPHY;  Surgeon: Jordan, Peter M, MD;  [facility] INVASIVE CV LAB;  Service: Cardiovascular;  Laterality: N/A;   SPACE OAR INSTILLATION  N/A 10/19/2018   Procedure: SPACE OAR INSTILLATION;  Surgeon: Devere Lonni Righter, MD;  Location: [facility];  Service: Urology;  Laterality: N/A;   Social History:  reports that he has never smoked. He has never used smokeless tobacco. He reports current alcohol use. He reports that he does not use drugs. Family History:  Family History  Problem Relation Age of Onset   Diabetes Brother    Non-Hodgkin's lymphoma Father    Breast cancer Paternal Aunt    Breast cancer Paternal Aunt    Prostate cancer Neg Hx    Pancreatic cancer Neg Hx    Colon cancer Neg  Hx      HOME MEDICATIONS: Allergies as of 10/09/2024       Reactions   Codeine Other (See Comments)   Heart races        Medication List        Accurate as of October 09, 2024  9:29 AM. If you have any questions, ask your nurse or doctor.          aspirin 81 MG chewable tablet Chew 81 mg by mouth daily.   carvedilol  3.125 MG tablet Commonly known as: COREG  Take 1 tablet (3.125 mg total) by mouth 2 (two) times daily.   FOLIC ACID PO Take 1 tablet by mouth daily at 12 noon.   furosemide  40 MG tablet Commonly known as: LASIX  Take 1 tablet (40 mg total) by mouth 2 (two) times daily. Take 40 mg twice a day for one week, then decrease to 40 mg daily.   glipiZIDE  10 MG tablet Commonly known as: GLUCOTROL  Take 1 tablet (10 mg total) by mouth 2 (two) times daily before a meal.   lisinopril 10 MG tablet Commonly known as: ZESTRIL Take 10 mg by mouth every evening.   MAGNESIUM PO Take 1 tablet by mouth daily.   metFORMIN  500 MG 24 hr tablet Commonly known as: GLUCOPHAGE -XR Take 4 tablets (2,000 mg total) by mouth daily.   omeprazole 40 MG capsule Commonly known as: PRILOSEC Take 40 mg by mouth every evening.   Ozempic  (2 MG/DOSE) 8 MG/3ML Sopn Generic drug: Semaglutide  (2 MG/DOSE) Inject 2 mg into the skin once a week.   potassium chloride  SA 20 MEQ tablet Commonly known as: KLOR-CON  M Take 1 tablet (20 mEq total) by mouth daily.   rosuvastatin  10 MG tablet Commonly known as: CRESTOR  Take 1 tablet (10 mg total) by mouth every evening.   tamsulosin 0.4 MG Caps capsule Commonly known as: FLOMAX Take 0.4 mg by mouth daily.   VITAMIN B12 PO Take 1 tablet by mouth daily at 12 noon.   VITAMIN C PO Take 1 tablet by mouth daily at 12 noon.   VITAMIN D-3 PO Take 1 capsule by mouth daily.   ZINC PO Take 1 tablet by mouth daily at 12 noon.         OBJECTIVE:   Vital Signs: There were no vitals taken for this visit.  Wt Readings from Last 3  Encounters:  09/27/24 (!) 340 lb (154.2 kg)  09/25/24 (!) 340 lb (154.2 kg)  08/16/24 (!) 340 lb (154.2 kg)     Exam: General: Pt appears well and is in NAD  Lungs: Clear with good BS bilat   Heart: RRR   Extremities: Trace pretibial edema.   Neuro: MS is good with appropriate affect, pt is alert and Ox3   DM Foot Exam 10/09/2024 The skin of the feet is without sores  or ulcerations, with thickened nails  The pedal pulses are 2+ on right and 2+ on left. The sensation is absent   to a screening 5.07, 10 gram monofilament bilaterally   DATA REVIEWED:  Lab Results  Component Value Date   HGBA1C 6.9 (A) 05/30/2024   HGBA1C 7.1 (A) 01/18/2024   HGBA1C 7.6 (A) 08/19/2023    Latest Reference Range & Units 09/27/24 13:59  Sodium 135 - 145 mmol/L 135 - 145 mmol/L 137 139  Potassium 3.5 - 5.1 mmol/L 3.5 - 5.1 mmol/L 4.0 4.1  Calcium  Ionized 1.15 - 1.40 mmol/L 1.15 - 1.40 mmol/L 1.15 1.24    Latest Reference Range & Units 09/01/24 08:49  Total CHOL/HDL Ratio 0.0 - 5.0 ratio 4.1  Cholesterol, Total 100 - 199 mg/dL 880  HDL Cholesterol >60 mg/dL 29 (L)  Triglycerides 0 - 149 mg/dL 847 (H)  VLDL Cholesterol Cal 5 - 40 mg/dL 26  LDL Chol Calc (NIH) 0 - 99 mg/dL 64  (L): Data is abnormally low (H): Data is abnormally high  ASSESSMENT / PLAN / RECOMMENDATIONS:   1) Type 2 Diabetes Mellitus, OPtimally controlled, With  Neuropathic  complications - Most recent A1c of 6.9%. Goal A1c < 7.0 %.    -A1c remains optimal -Due to weight gain on maximum dose of Mounjaro , the patient opted to go back to using Ozempic  as he had better results -He was unable to tolerate Farxiga  10 mg due to recurrent UTIs, given recent diagnosis of CHF, I did recommend trying Jardiance , caution against genital infections  MEDICATIONS: Start Jardiance  10 mg daily Continue glipizide  10 mg, 2 tabs BID Continue metformin  500 mg XR, 2 tabs twice daily Continue Ozempic  2 mg weekly   EDUCATION /  INSTRUCTIONS: BG monitoring instructions: Patient is instructed to check his blood sugars 1 times a week. Call [facility] if: BG persistently < 70  I reviewed the Rule of 15 for the treatment of hypoglycemia in detail with the patient. Literature supplied.    2) Diabetic complications:  Eye: Does not have known diabetic retinopathy.  Neuro/ Feet: Does  have known diabetic peripheral neuropathy .  Renal: Patient does not have known baseline CKD. He   is on an ACEI/ARB at present.     F/U in 6 months  I spent 25 minutes preparing to see the patient by review of recent labs, imaging and procedures, obtaining and reviewing separately obtained history, communicating with the patient/family or caregiver, ordering medications, tests or procedures, and documenting clinical information in the EHR including the differential Dx, treatment, and any further evaluation and other management   Signed electronically by: Stefano Redgie Butts, MD  [facility] Endocrinology  [facility] [address] [place] [place], [place] [address] Phone: [phone] FAX: [phone]   CC: Center, [facility] [address] [place] [address] Phone: [phone]  Fax: [phone]  Return to Endocrinology clinic as below: Future Appointments  Date Time Provider Department Center  10/09/2024  9:30 AM Javaun Dimperio, Donell Redgie, MD [facility] None  10/11/2024  1:55 PM West, Katlyn D, NP [facility]  10/25/2024  1:15 PM Herschel Allean CROME, RPH-CPP [facility]V  01/02/2025  9:00 AM Court Dorn PARAS, MD [facility]

## 2024-10-09 NOTE — Patient Instructions (Addendum)
 Start Jardiance 10mg , 1 tablet every morning  Take Glipizide  10 mg, 1 tablet before Breakfast and 1 tablet before Supper Continue Metformin  500 mg XR  ,2  tablets  twice daily  Continue Ozempic  2 mg weekly       HOW TO TREAT LOW BLOOD SUGARS (Blood sugar LESS THAN 70 MG/DL) Please follow the RULE OF 15 for the treatment of hypoglycemia treatment (when your (blood sugars are less than 70 mg/dL)   STEP 1: Take 15 grams of carbohydrates when your blood sugar is low, which includes:  3-4 GLUCOSE TABS  OR 3-4 OZ OF JUICE OR REGULAR SODA OR ONE TUBE OF GLUCOSE GEL    STEP 2: RECHECK blood sugar in 15 MINUTES STEP 3: If your blood sugar is still low at the 15 minute recheck --> then, go back to STEP 1 and treat AGAIN with another 15 grams of carbohydrates.

## 2024-10-11 ENCOUNTER — Other Ambulatory Visit (HOSPITAL_COMMUNITY): Payer: Self-pay

## 2024-10-11 ENCOUNTER — Ambulatory Visit: Attending: Cardiology | Admitting: Cardiology

## 2024-10-11 ENCOUNTER — Encounter: Payer: Self-pay | Admitting: Cardiology

## 2024-10-11 VITALS — BP 124/70 | HR 82 | Resp 17 | Ht 72.0 in | Wt 336.0 lb

## 2024-10-11 DIAGNOSIS — I10 Essential (primary) hypertension: Secondary | ICD-10-CM | POA: Insufficient documentation

## 2024-10-11 DIAGNOSIS — I5020 Unspecified systolic (congestive) heart failure: Secondary | ICD-10-CM | POA: Insufficient documentation

## 2024-10-11 DIAGNOSIS — E785 Hyperlipidemia, unspecified: Secondary | ICD-10-CM | POA: Diagnosis present

## 2024-10-11 DIAGNOSIS — I519 Heart disease, unspecified: Secondary | ICD-10-CM

## 2024-10-11 DIAGNOSIS — I429 Cardiomyopathy, unspecified: Secondary | ICD-10-CM | POA: Insufficient documentation

## 2024-10-11 MED ORDER — ROSUVASTATIN CALCIUM 20 MG PO TABS: 20.0000 mg | ORAL_TABLET | Freq: Every evening | ORAL | 3 refills | Status: AC

## 2024-10-11 MED ORDER — SACUBITRIL-VALSARTAN 24-26 MG PO TABS: 1.0000 | ORAL_TABLET | Freq: Two times a day (BID) | ORAL | 3 refills | Status: AC

## 2024-10-11 NOTE — Patient Instructions (Addendum)
 Medication Instructions:  ] START TAKING :  CRESTOR    20 MG ONCE  A  DAY    STARTING  FRIDAY  10-13-24  START TAKING:   ENTRESTO  24-26 TWICE  A DAY     STOP TAKING AND REMOVE THIS MEDICATION FROM YOUR MEDICATION LIST:  LISINOPRIL  TODAY       *If you need a refill on your cardiac medications before your next appointment, please call your pharmacy*   Lab Work:  PLEASE GO DOWN STAIRS  [facility]  FIRST FLOOR   ( GET OFF ELEVATORS WALK TOWARDS WAITING AREA LAB LOCATED BY PHARMACY):    TODAY   CMET  RETURN IN 2 WEEKS FOR BMET  RETURN IN 6 TO 8 WEEKS FOR FASTING LIPIDS AND LIVER     If you have labs (blood work) drawn today and your tests are completely normal, you will receive your results only by: MyChart Message (if you have MyChart) OR A paper copy in the mail If you have any lab test that is abnormal or we need to change your treatment, we will call you to review the results.  Testing/Procedures:  Your physician has requested that you have a cardiac MRI. Cardiac MRI uses a computer to create images of your heart as its beating, producing both still and moving pictures of your heart and major blood vessels. For further information please visit [email or web address]. Please follow the instruction sheet given to you today for more information.     You are scheduled for Cardiac MRI at the location below.  Please arrive for your appointment at ______________ . ?  [facility] [address] [place], [place] [address] Please take advantage of the free valet parking available at the [facility] and [employer] (Entrance C).  Proceed to the [facility] Radiology Department (First Floor) for check-in.   OR   [facility] [address] [place], [place] [address] Please go to the [facility] and check-in with the desk attendant.   Magnetic resonance imaging (MRI) is a painless test that produces images of the inside of the body without using  Xrays.  During an MRI, strong magnets and radio waves work together in a data processing manager to form detailed images.   MRI images may provide more details about a medical condition than X-rays, CT scans, and ultrasounds can provide.  You may be given earphones to listen for instructions.  You may eat a light breakfast and take medications as ordered with the exception of furosemide , hydrochlorothiazide, chlorthalidone or spironolactone (or any other fluid pill). If you are undergoing a stress MRI, please avoid stimulants for 12 hr prior to test. (I.e. Caffeine, nicotine, chocolate, or antihistamine medications)  If your provider has ordered anti-anxiety medications for this test, then you will need a driver.  An IV will be inserted into one of your veins. Contrast material will be injected into your IV. It will leave your body through your urine within a day. You may be told to drink plenty of fluids to help flush the contrast material out of your system.  You will be asked to remove all metal, including: Watch, jewelry, and other metal objects including hearing aids, hair pieces and dentures. Also wearable glucose monitoring systems (ie. Freestyle Libre and Omnipods) (Braces and fillings normally are not a problem.)   TEST WILL TAKE APPROXIMATELY 1 HOUR  PLEASE NOTIFY SCHEDULING AT LEAST 24 HOURS IN ADVANCE IF YOU ARE UNABLE TO KEEP YOUR  APPOINTMENT. [phone]  For more information and frequently asked questions, please visit our website : [email or web address]  Please call the Cardiac Imaging Nurse Navigators with any questions/concerns. [phone] Office    Follow-Up: At [facility], you and your health needs are our priority.  As part of our continuing mission to provide you with exceptional heart care, our providers are all part of one team.  This team includes your primary Cardiologist (physician) and Advanced Practice Providers or APPs (Physician Assistants  and Nurse Practitioners) who all work together to provide you with the care you need, when you need it.  Your next appointment:   AS  ALREADY  SCHEDULED    We recommend signing up for the patient portal called MyChart.  Sign up information is provided on this After Visit Summary.  MyChart is used to connect with patients for Virtual Visits (Telemedicine).  Patients are able to view lab/test results, encounter notes, upcoming appointments, etc.  Non-urgent messages can be sent to your provider as well.   To learn more about what you can do with MyChart, go to [email or web address].   Other Instructions

## 2024-10-12 ENCOUNTER — Ambulatory Visit: Payer: Self-pay | Admitting: Cardiology

## 2024-10-12 DIAGNOSIS — Z79899 Other long term (current) drug therapy: Secondary | ICD-10-CM

## 2024-10-12 LAB — COMPREHENSIVE METABOLIC PANEL WITH GFR
ALT: 24 IU/L (ref 0–44)
AST: 20 IU/L (ref 0–40)
Albumin: 4.1 g/dL (ref 3.9–4.9)
Alkaline Phosphatase: 78 IU/L (ref 47–123)
BUN/Creatinine Ratio: 16 (ref 10–24)
BUN: 13 mg/dL (ref 8–27)
Bilirubin Total: 0.5 mg/dL (ref 0.0–1.2)
CO2: 22 mmol/L (ref 20–29)
Calcium: 9.5 mg/dL (ref 8.6–10.2)
Chloride: 96 mmol/L (ref 96–106)
Creatinine, Ser: 0.82 mg/dL (ref 0.76–1.27)
Globulin, Total: 2.2 g/dL (ref 1.5–4.5)
Glucose: 110 mg/dL — ABNORMAL HIGH (ref 70–99)
Potassium: 4.7 mmol/L (ref 3.5–5.2)
Sodium: 136 mmol/L (ref 134–144)
Total Protein: 6.3 g/dL (ref 6.0–8.5)
eGFR: 96 mL/min/1.73 (ref >59)

## 2024-10-12 NOTE — Telephone Encounter (Signed)
 Patient read message sent via MyChart 10/12/24.  CBC ordered to be completed in 2 weeks per Katlyn West, NP

## 2024-10-25 ENCOUNTER — Ambulatory Visit: Attending: Cardiovascular Disease | Admitting: Pharmacist Clinician (PhC)/ Clinical Pharmacy Specialist

## 2024-10-25 ENCOUNTER — Encounter: Payer: Self-pay | Admitting: Pharmacist Clinician (PhC)/ Clinical Pharmacy Specialist

## 2024-10-25 VITALS — BP 108/76 | HR 56

## 2024-10-25 DIAGNOSIS — I5020 Unspecified systolic (congestive) heart failure: Secondary | ICD-10-CM | POA: Diagnosis present

## 2024-10-25 LAB — BASIC METABOLIC PANEL WITH GFR
BUN/Creatinine Ratio: 16 (ref 10–24)
BUN: 15 mg/dL (ref 8–27)
CO2: 25 mmol/L (ref 20–29)
Calcium: 9.5 mg/dL (ref 8.6–10.2)
Chloride: 98 mmol/L (ref 96–106)
Creatinine, Ser: 0.92 mg/dL (ref 0.76–1.27)
Glucose: 134 mg/dL — ABNORMAL HIGH (ref 70–99)
Potassium: 4.2 mmol/L (ref 3.5–5.2)
Sodium: 140 mmol/L (ref 134–144)
eGFR: 91 mL/min/1.73 (ref >59)

## 2024-10-25 NOTE — Patient Instructions (Signed)
 Follow up appointment: Wednesday December 31 at 1:15  Go to the lab in 2 weeks (Tuesday December 30)  Take your meds as follows:   STOP POTASSIUM SUPPLEMENT  START SPIRONOLACTONE 12.5 MG (1/2 OF 25 MG TABLET) ONCE DAILY  CONTINUE WITH ALL OTHER MEDICATION  Check your blood pressure at home daily and keep record of the readings.  Your blood pressure goal is < 130/80  To check your pressure at home you will need to:  1. Sit up in a chair, with feet flat on the floor and back supported. Do not cross your ankles or legs. 2. Rest your left arm so that the cuff is about heart level. If the cuff goes on your upper arm,  then just relax the arm on the table, arm of the chair or your lap. If you have a wrist cuff, we  suggest relaxing your wrist against your chest (think of it as Pledging the Flag with the  wrong arm).  3. Place the cuff snugly around your arm, about 1 inch above the crook of your elbow. The  cords should be inside the groove of your elbow.  4. Sit quietly, with the cuff in place, for about 5 minutes. After that 5 minutes press the power  button to start a reading. 5. Do not talk or move while the reading is taking place.  6. Record your readings on a sheet of paper. Although most cuffs have a memory, it is often  easier to see a pattern developing when the numbers are all in front of you.  7. You can repeat the reading after 1-3 minutes if it is recommended  Make sure your bladder is empty and you have not had caffeine or tobacco within the last 30 min  Always bring your blood pressure log with you to your appointments. If you have not brought your monitor in to be double checked for accuracy, please bring it to your next appointment.  You can find a list of quality blood pressure cuffs at [email or web address]  Important lifestyle changes to control high blood pressure  Intervention  Effect on the BP  Lose extra pounds and watch your waistline Weight loss is one of the most  effective lifestyle changes for controlling blood pressure. If you're overweight or obese, losing even a small amount of weight can help reduce blood pressure. Blood pressure might go down by about 1 millimeter of mercury (mm Hg) with each kilogram (about 2.2 pounds) of weight lost.  Exercise regularly As a general goal, aim for at least 30 minutes of moderate physical activity every day. Regular physical activity can lower high blood pressure by about 5 to 8 mm Hg.  Eat a healthy diet Eating a diet rich in whole grains, fruits, vegetables, and low-fat dairy products and low in saturated fat and cholesterol. A healthy diet can lower high blood pressure by up to 11 mm Hg.  Reduce salt (sodium) in your diet Even a small reduction of sodium in the diet can improve heart health and reduce high blood pressure by about 5 to 6 mm Hg.  Limit alcohol One drink equals 12 ounces of beer, 5 ounces of wine, or 1.5 ounces of 80-proof liquor.  Limiting alcohol to less than one drink a day for women or two drinks a day for men can help lower blood pressure by about 4 mm Hg.   If you have any questions or concerns please use My Chart to send questions or call  the office at [phone]

## 2024-10-25 NOTE — Assessment & Plan Note (Addendum)
 Assessment: BP in office today is 108/76 Patient with HFrEF Tolerates current medications without any side effects (Entresto , empagliflozin , carvedilol ) Denies SOB, palpitation, chest pain, headaches,or edema No concerns with regards to cost of medications, compliance, ability to pick up at pharmacy Reiterated the importance of regular exercise and low salt diet  Plan: GDMT ACEI/ARB/ARNI Entresto  24/26 mg bid No change  Beta blocker Carvedilol  3.125 mg bid No change  MRA Spironolactone 12.5 mg daily Start 12/18  SGLT2 Empagliflozin  10 mg daily No change   Stop potassium supplementation Labs orderd:  BMET on 11/07/24 Follow up with me on 11/08/24

## 2024-10-25 NOTE — Progress Notes (Signed)
 Office Visit    Patient Name: Tony Griffin. Date of Encounter: 10/25/2024  Primary Care Provider:  [facility] Primary Cardiologist:  None  Chief Complaint    Heart Failure Medication Titration - EF 25-30% (by echo 09/2024)  Significant Past Medical History   CAD Mild, non-obstructive, CAC = 263 (65th percentile)  HTN Elevated at last visit, manage with GDMT  HLD LDL 119, rosuvastatin  recently increased to 20 mg daily  OSA Sleep study pending  DM2 12/25 A1c 6.9 on glipizide , metformin , Ozempic  2 mg, empagliflozin      Allergies[1]  History of Present Illness    Tony Griffin. is a 68 y.o. male patient of Dr Court, in the office today for titration of GDMT for HFrEF.   He was first seen in cardiology in October after an ED visit for shortness of breath.  At the hospital his BNP was 490 and he did have some lower extremity edema.  When Dr. Court saw him the furosemide  was doubled to 40 mg bid.  Echo then showed reduced EF, coronary calcium  score was 263 but cath was considered non-obstructive.  Most recently he was seen by Katlyn West NP, who switched his lisinopril to Entresto  24/26.  Today he is in the office with his wife for follow up of GDMT.  Tolerating Entresto , carvedilol  and empagliflozin  well, with no issues.  Most recent labs showed potassium at 4.7, but he is taking 20 mEq supplementation daily with his furosemide  dose.  Notes home BP readings are 105-115 systolic, mostly 29'd diastolic.    Blood Pressure Goal:  130/80  GDMT: ACEI/ARB/ARNI [x] Yes [] No Entresto  24/26 mg bid (started 10/11/24)  Beta blocker [x] Yes [] No Carvedilol  3.125 mg bid (started 09/25/24)  MRA [] Yes [x] No Last K at 4.7 (10/11/24)  SGLT2 inhibitor [x] Yes [] No Empagliflozin  10 mg daily (started 10/09/24)   Previously tried: lisinopril - switched to Entresto   Social Hx:      Tobacco: no  Alcohol:no  Caffeine:diet Coke 1-2 cans per day, unsweet tea (sweet and low)  Diet: eats out  regularly, not much at home; likes sweets (cookies, pecan pie), more sit down restaurant (meat/potatoes), Chinese food at least weekly; snacks crackers and nuts (unsalted); plenty of vegetables  Exercise: very little  Home BP readings:  105-125/70-80 by recall   Accessory Clinical Findings    Lab Results  Component Value Date   CREATININE 0.82 10/11/2024   BUN 13 10/11/2024   NA 136 10/11/2024   K 4.7 10/11/2024   CL 96 10/11/2024   CO2 22 10/11/2024   Lab Results  Component Value Date   ALT 24 10/11/2024   AST 20 10/11/2024   ALKPHOS 78 10/11/2024   BILITOT 0.5 10/11/2024   Lab Results  Component Value Date   HGBA1C 6.9 (A) 10/09/2024    Home Medications/Allergies    Current Outpatient Medications  Medication Sig Dispense Refill   spironolactone (ALDACTONE) 25 MG tablet Take 0.5 tablets (12.5 mg total) by mouth daily. 45 tablet 3   Ascorbic Acid (VITAMIN C PO) Take 1 tablet by mouth daily at 12 noon.     aspirin 81 MG chewable tablet Chew 81 mg by mouth daily.     carvedilol  (COREG ) 3.125 MG tablet Take 1 tablet (3.125 mg total) by mouth 2 (two) times daily. 180 tablet 3   Cholecalciferol (VITAMIN D-3 PO) Take 1 capsule by mouth daily.     Cyanocobalamin (VITAMIN B12 PO) Take 1 tablet by mouth daily at 12 noon.  empagliflozin  (JARDIANCE ) 10 MG TABS tablet Take 1 tablet (10 mg total) by mouth daily. 90 tablet 3   FOLIC ACID PO Take 1 tablet by mouth daily at 12 noon.     furosemide  (LASIX ) 40 MG tablet Take 1 tablet (40 mg total) by mouth 2 (two) times daily. Take 40 mg twice a day for one week, then decrease to 40 mg daily. 90 tablet 3   glipiZIDE  (GLUCOTROL ) 10 MG tablet Take 1 tablet (10 mg total) by mouth 2 (two) times daily before a meal. 180 tablet 3   MAGNESIUM PO Take 1 tablet by mouth daily.     metFORMIN  (GLUCOPHAGE -XR) 500 MG 24 hr tablet Take 4 tablets (2,000 mg total) by mouth daily. 360 tablet 3   Multiple Vitamins-Minerals (ZINC PO) Take 1 tablet by  mouth daily at 12 noon.     omeprazole (PRILOSEC) 40 MG capsule Take 40 mg by mouth every evening.      rosuvastatin  (CRESTOR ) 20 MG tablet Take 1 tablet (20 mg total) by mouth every evening. 90 tablet 3   sacubitril -valsartan  (ENTRESTO ) 24-26 MG Take 1 tablet by mouth 2 (two) times daily. 180 tablet 3   Semaglutide , 2 MG/DOSE, (OZEMPIC , 2 MG/DOSE,) 8 MG/3ML SOPN Inject 2 mg into the skin once a week. 9 mL 3   tamsulosin (FLOMAX) 0.4 MG CAPS capsule Take 0.4 mg by mouth daily.     No current facility-administered medications for this visit.     Allergies[2]     Assessment & Plan      HFrEF (heart failure with reduced ejection fraction) (HCC) Assessment: BP in office today is 108/76 Patient with HFrEF Tolerates current medications without any side effects (Entresto , empagliflozin , carvedilol ) Denies SOB, palpitation, chest pain, headaches,or edema No concerns with regards to cost of medications, compliance, ability to pick up at pharmacy Reiterated the importance of regular exercise and low salt diet  Plan: GDMT ACEI/ARB/ARNI Entresto  24/26 mg bid No change  Beta blocker Carvedilol  3.125 mg bid No change  MRA Spironolactone 12.5 mg daily Start 12/18  SGLT2 Empagliflozin  10 mg daily No change   Stop potassium supplementation Labs orderd:  BMET on 11/07/24 Follow up with me on 11/08/24   Allean Mink PharmD CPP [facility]  [address] [place], [place] [address] [phone]     [1]  Allergies Allergen Reactions   Codeine Other (See Comments)    Heart races  [2]  Allergies Allergen Reactions   Codeine Other (See Comments)    Heart races

## 2024-10-31 ENCOUNTER — Ambulatory Visit: Admitting: Cardiology

## 2024-11-08 ENCOUNTER — Ambulatory Visit: Admitting: Pharmacist Clinician (PhC)/ Clinical Pharmacy Specialist

## 2024-11-08 ENCOUNTER — Encounter: Payer: Self-pay | Admitting: Pharmacist Clinician (PhC)/ Clinical Pharmacy Specialist

## 2024-11-08 DIAGNOSIS — I5020 Unspecified systolic (congestive) heart failure: Secondary | ICD-10-CM | POA: Diagnosis not present

## 2024-11-08 LAB — HEPATIC FUNCTION PANEL
ALT: 22 IU/L (ref 0–44)
AST: 20 IU/L (ref 0–40)
Albumin: 4 g/dL (ref 3.9–4.9)
Alkaline Phosphatase: 73 IU/L (ref 47–123)
Bilirubin Total: 0.8 mg/dL (ref 0.0–1.2)
Bilirubin, Direct: 0.34 mg/dL (ref 0.00–0.40)
Total Protein: 6.5 g/dL (ref 6.0–8.5)

## 2024-11-08 NOTE — Assessment & Plan Note (Addendum)
 Assessment: BP in office today is 98/62 Patient with no symptoms of hypotension.   Patient with HFrEF (25-30% by echo) Tolerates current medications without any side effects full GDMT Denies SOB, palpitation, chest pain, headaches,or edema No concerns with regards to cost of medications, compliance, ability to pick up at pharmacy Reiterated the importance of regular exercise and low salt diet  Plan: GDMT ACEI/ARB/ARNI Entresto  24/26 bid   Beta blocker Carvedilol  3.125 mg bid   MRA Spironolactone 12.5 mg qd   SGLT2 Empagliflozin  10 mg qd    Labs orderd:  BMET this morning No room to increase medications, based on office BP reading Follow up with Dr. Court in February

## 2024-11-08 NOTE — Progress Notes (Signed)
 "  Office Visit    Patient Name: Tony Griffin. Date of Encounter: 11/08/2024  Primary Care Provider:  [facility] Primary Cardiologist:  None  Chief Complaint    Heart Failure Medication Titration - EF 25-30% (by echo 09/2024)  Significant Past Medical History   CAD Mild, non-obstructive, CAC = 263 (65th percentile)  HTN Elevated at last visit, manage with GDMT  HLD LDL 119, rosuvastatin  recently increased to 20 mg daily  OSA Sleep study pending  DM2 12/25 A1c 6.9 on glipizide , metformin , Ozempic  2 mg, empagliflozin      Allergies[1]  History of Present Illness    Tony Griffin. is a 68 y.o. male patient of Dr Court, in the office today for titration of GDMT for HFrEF.   He was first seen in cardiology in October after an ED visit for shortness of breath.  At the hospital his BNP was 490 and he did have some lower extremity edema.  When Dr. Court saw him the furosemide  was doubled to 40 mg bid.  Echo then showed reduced EF, coronary calcium  score was 263 but cath was considered non-obstructive.  Most recently he was seen by Katlyn West NP, who switched his lisinopril to Entresto  24/26.  I saw him two weeks ago and added spironolactone 12.5 mg daily, as well as stopping his potassium supplement.  BMET that day showed potassium stable at 4.2  Today he is in the office for follow up of GDMT.  He has had no issues with the addition of spironolactone.  Went to the lab this morning to get BMET.     Blood Pressure Goal:  130/80  GDMT: ACEI/ARB/ARNI [x] Yes [] No Entresto  24/26 mg bid (started 10/11/24)  Beta blocker [x] Yes [] No Carvedilol  3.125 mg bid (started 09/25/24)  MRA [x] Yes [] No Spironolactone 12.5 mg daily  (started 10/26/24)  SGLT2 inhibitor [x] Yes [] No Empagliflozin  10 mg daily (started 10/09/24)   Previously tried: lisinopril - switched to Entresto   Social Hx:      Tobacco: no  Alcohol:no  Caffeine:diet Coke 1-2 cans per day, unsweet tea (sweet and  low)  Diet: eats out regularly, not much at home; likes sweets (cookies, pecan pie), more sit down restaurant (meat/potatoes), Chinese food at least weekly; snacks crackers and nuts (unsalted); plenty of vegetables  Exercise: very little  Home BP readings:  brought home device with him.  Read 23/25 points higher than office device, even when measured on forearm.    Home reading average 125/80  (adjusted would be ~ 102/55)      Accessory Clinical Findings    Lab Results  Component Value Date   CREATININE 0.92 10/25/2024   BUN 15 10/25/2024   NA 140 10/25/2024   K 4.2 10/25/2024   CL 98 10/25/2024   CO2 25 10/25/2024   Lab Results  Component Value Date   ALT 24 10/11/2024   AST 20 10/11/2024   ALKPHOS 78 10/11/2024   BILITOT 0.5 10/11/2024   Lab Results  Component Value Date   HGBA1C 6.9 (A) 10/09/2024    Home Medications/Allergies    Current Outpatient Medications  Medication Sig Dispense Refill   Ascorbic Acid (VITAMIN C PO) Take 1 tablet by mouth daily at 12 noon.     aspirin 81 MG chewable tablet Chew 81 mg by mouth daily.     carvedilol  (COREG ) 3.125 MG tablet Take 1 tablet (3.125 mg total) by mouth 2 (two) times daily. 180 tablet 3   Cholecalciferol (VITAMIN D-3 PO) Take  1 capsule by mouth daily.     Cyanocobalamin (VITAMIN B12 PO) Take 1 tablet by mouth daily at 12 noon.     empagliflozin  (JARDIANCE ) 10 MG TABS tablet Take 1 tablet (10 mg total) by mouth daily. 90 tablet 3   FOLIC ACID PO Take 1 tablet by mouth daily at 12 noon.     furosemide  (LASIX ) 40 MG tablet Take 1 tablet (40 mg total) by mouth 2 (two) times daily. Take 40 mg twice a day for one week, then decrease to 40 mg daily. 90 tablet 3   glipiZIDE  (GLUCOTROL ) 10 MG tablet Take 1 tablet (10 mg total) by mouth 2 (two) times daily before a meal. 180 tablet 3   MAGNESIUM PO Take 1 tablet by mouth daily.     metFORMIN  (GLUCOPHAGE -XR) 500 MG 24 hr tablet Take 4 tablets (2,000 mg total) by mouth daily. 360  tablet 3   Multiple Vitamins-Minerals (ZINC PO) Take 1 tablet by mouth daily at 12 noon.     omeprazole (PRILOSEC) 40 MG capsule Take 40 mg by mouth every evening.      rosuvastatin  (CRESTOR ) 20 MG tablet Take 1 tablet (20 mg total) by mouth every evening. 90 tablet 3   sacubitril -valsartan  (ENTRESTO ) 24-26 MG Take 1 tablet by mouth 2 (two) times daily. 180 tablet 3   Semaglutide , 2 MG/DOSE, (OZEMPIC , 2 MG/DOSE,) 8 MG/3ML SOPN Inject 2 mg into the skin once a week. 9 mL 3   spironolactone (ALDACTONE) 25 MG tablet Take 0.5 tablets (12.5 mg total) by mouth daily. 45 tablet 3   tamsulosin (FLOMAX) 0.4 MG CAPS capsule Take 0.4 mg by mouth daily.     No current facility-administered medications for this visit.     Allergies[2]     Assessment & Plan      HFrEF (heart failure with reduced ejection fraction) (HCC) Assessment: BP in office today is 98/62 Patient with no symptoms of hypotension.   Patient with HFrEF (25-30% by echo) Tolerates current medications without any side effects full GDMT Denies SOB, palpitation, chest pain, headaches,or edema No concerns with regards to cost of medications, compliance, ability to pick up at pharmacy Reiterated the importance of regular exercise and low salt diet  Plan: GDMT ACEI/ARB/ARNI Entresto  24/26 bid   Beta blocker Carvedilol  3.125 mg bid   MRA Spironolactone 12.5 mg qd   SGLT2 Empagliflozin  10 mg qd    Labs orderd:  BMET this morning No room to increase medications, based on office BP reading Follow up with Dr. Court in February   Allean Mink PharmD CPP [facility]  [address] [place], [place] [address] [phone]     [1]  Allergies Allergen Reactions   Codeine Other (See Comments)    Heart races  [2]  Allergies Allergen Reactions   Codeine Other (See Comments)    Heart races   "

## 2024-11-08 NOTE — Patient Instructions (Signed)
 Follow up appointment: with Dr. Court in February  Take your BP meds as follows: no changes to your medications today  Check your blood pressure at home daily (if able) and keep record of the readings.  Your blood pressure goal is < 130/80  To check your pressure at home you will need to:  1. Sit up in a chair, with feet flat on the floor and back supported. Do not cross your ankles or legs. 2. Rest your left arm so that the cuff is about heart level. If the cuff goes on your upper arm,  then just relax the arm on the table, arm of the chair or your lap. If you have a wrist cuff, we  suggest relaxing your wrist against your chest (think of it as Pledging the Flag with the  wrong arm).  3. Place the cuff snugly around your arm, about 1 inch above the crook of your elbow. The  cords should be inside the groove of your elbow.  4. Sit quietly, with the cuff in place, for about 5 minutes. After that 5 minutes press the power  button to start a reading. 5. Do not talk or move while the reading is taking place.  6. Record your readings on a sheet of paper. Although most cuffs have a memory, it is often  easier to see a pattern developing when the numbers are all in front of you.  7. You can repeat the reading after 1-3 minutes if it is recommended  Make sure your bladder is empty and you have not had caffeine or tobacco within the last 30 min  Always bring your blood pressure log with you to your appointments. If you have not brought your monitor in to be double checked for accuracy, please bring it to your next appointment.  You can find a list of quality blood pressure cuffs at [email or web address]  Important lifestyle changes to control high blood pressure  Intervention  Effect on the BP  Lose extra pounds and watch your waistline Weight loss is one of the most effective lifestyle changes for controlling blood pressure. If you're overweight or obese, losing even a small amount of weight can  help reduce blood pressure. Blood pressure might go down by about 1 millimeter of mercury (mm Hg) with each kilogram (about 2.2 pounds) of weight lost.  Exercise regularly As a general goal, aim for at least 30 minutes of moderate physical activity every day. Regular physical activity can lower high blood pressure by about 5 to 8 mm Hg.  Eat a healthy diet Eating a diet rich in whole grains, fruits, vegetables, and low-fat dairy products and low in saturated fat and cholesterol. A healthy diet can lower high blood pressure by up to 11 mm Hg.  Reduce salt (sodium) in your diet Even a small reduction of sodium in the diet can improve heart health and reduce high blood pressure by about 5 to 6 mm Hg.  Limit alcohol One drink equals 12 ounces of beer, 5 ounces of wine, or 1.5 ounces of 80-proof liquor.  Limiting alcohol to less than one drink a day for women or two drinks a day for men can help lower blood pressure by about 4 mm Hg.   If you have any questions or concerns please use My Chart to send questions or call the office at [phone]

## 2024-11-13 ENCOUNTER — Ambulatory Visit: Admitting: Pharmacist

## 2024-11-16 NOTE — Telephone Encounter (Signed)
 Patient returned staff call regarding results.

## 2024-11-18 LAB — HEPATIC FUNCTION PANEL
ALT: 25 IU/L (ref 0–44)
AST: 17 IU/L (ref 0–40)
Albumin: 4.2 g/dL (ref 3.9–4.9)
Alkaline Phosphatase: 78 IU/L (ref 47–123)
Bilirubin Total: 0.6 mg/dL (ref 0.0–1.2)
Bilirubin, Direct: 0.23 mg/dL (ref 0.00–0.40)
Total Protein: 6.4 g/dL (ref 6.0–8.5)

## 2024-11-27 ENCOUNTER — Encounter (HOSPITAL_COMMUNITY): Payer: Self-pay

## 2024-11-29 ENCOUNTER — Other Ambulatory Visit: Payer: Self-pay | Admitting: Cardiology

## 2024-11-29 ENCOUNTER — Ambulatory Visit (HOSPITAL_COMMUNITY)
Admission: RE | Admit: 2024-11-29 | Discharge: 2024-11-29 | Disposition: A | Discharge status: Home / Self Care | Source: Ambulatory Visit | Attending: Cardiology | Admitting: Cardiology

## 2024-11-29 DIAGNOSIS — I429 Cardiomyopathy, unspecified: Secondary | ICD-10-CM

## 2024-11-29 MED ORDER — GADOBUTROL 1 MMOL/ML IV SOLN
10.0000 mL | Freq: Once | INTRAVENOUS | Status: AC | PRN
  Administered 2024-11-29: 10 mL via INTRAVENOUS

## 2024-12-01 ENCOUNTER — Ambulatory Visit: Payer: Self-pay | Admitting: Cardiology

## 2024-12-01 DIAGNOSIS — I5020 Unspecified systolic (congestive) heart failure: Secondary | ICD-10-CM

## 2025-01-02 ENCOUNTER — Ambulatory Visit: Admitting: Cardiovascular Disease

## 2025-04-09 ENCOUNTER — Ambulatory Visit: Admitting: Internal Medicine
